# Patient Record
Sex: Male | Born: 1950
Health system: Southern US, Community
[De-identification: ages and names within clinical notes are randomized; demographics above are authoritative.]

## PROBLEM LIST (undated history)

## (undated) DIAGNOSIS — E785 Hyperlipidemia, unspecified: Secondary | ICD-10-CM

## (undated) DIAGNOSIS — R7989 Other specified abnormal findings of blood chemistry: Secondary | ICD-10-CM

## (undated) DIAGNOSIS — K635 Polyp of colon: Secondary | ICD-10-CM

## (undated) DIAGNOSIS — C61 Malignant neoplasm of prostate: Secondary | ICD-10-CM

## (undated) DIAGNOSIS — K5790 Diverticulosis of intestine, part unspecified, without perforation or abscess without bleeding: Secondary | ICD-10-CM

## (undated) DIAGNOSIS — K219 Gastro-esophageal reflux disease without esophagitis: Secondary | ICD-10-CM

## (undated) DIAGNOSIS — E538 Deficiency of other specified B group vitamins: Secondary | ICD-10-CM

## (undated) HISTORY — DX: Malignant neoplasm of prostate: C61

## (undated) HISTORY — DX: Other specified abnormal findings of blood chemistry: R79.89

## (undated) HISTORY — DX: Deficiency of other specified B group vitamins: E53.8

## (undated) HISTORY — DX: Hyperlipidemia, unspecified: E78.5

## (undated) HISTORY — DX: Diverticulosis of intestine, part unspecified, without perforation or abscess without bleeding: K57.90

## (undated) HISTORY — DX: Polyp of colon: K63.5

## (undated) HISTORY — DX: Gastro-esophageal reflux disease without esophagitis: K21.9

## (undated) HISTORY — PX: BACK SURGERY: SHX140

---

## 1983-09-13 HISTORY — PX: BACK SURGERY: SHX140

## 2003-01-15 ENCOUNTER — Ambulatory Visit (HOSPITAL_COMMUNITY): Admission: RE | Admit: 2003-01-15 | Discharge: 2003-01-15 | Payer: Self-pay | Admitting: Cardiology

## 2004-11-30 ENCOUNTER — Ambulatory Visit: Payer: Self-pay

## 2005-11-30 ENCOUNTER — Ambulatory Visit: Payer: Self-pay | Admitting: Podiatry

## 2011-02-04 ENCOUNTER — Ambulatory Visit: Payer: Self-pay | Admitting: Internal Medicine

## 2011-02-11 ENCOUNTER — Ambulatory Visit: Payer: Self-pay | Admitting: Internal Medicine

## 2011-03-13 ENCOUNTER — Ambulatory Visit: Payer: Self-pay | Admitting: Internal Medicine

## 2011-04-13 ENCOUNTER — Ambulatory Visit: Payer: Self-pay | Admitting: Internal Medicine

## 2015-03-27 ENCOUNTER — Ambulatory Visit: Admit: 2015-03-27 | Payer: Self-pay | Admitting: Unknown Physician Specialty

## 2015-03-27 SURGERY — SEPTOPLASTY, NOSE, WITH NASAL TURBINATE REDUCTION
Anesthesia: General | Laterality: Bilateral

## 2015-10-01 DIAGNOSIS — K219 Gastro-esophageal reflux disease without esophagitis: Secondary | ICD-10-CM | POA: Diagnosis not present

## 2015-10-01 DIAGNOSIS — Z0000 Encounter for general adult medical examination without abnormal findings: Secondary | ICD-10-CM | POA: Diagnosis not present

## 2015-10-01 DIAGNOSIS — Z125 Encounter for screening for malignant neoplasm of prostate: Secondary | ICD-10-CM | POA: Diagnosis not present

## 2015-10-29 DIAGNOSIS — L821 Other seborrheic keratosis: Secondary | ICD-10-CM | POA: Diagnosis not present

## 2015-10-29 DIAGNOSIS — C44329 Squamous cell carcinoma of skin of other parts of face: Secondary | ICD-10-CM | POA: Diagnosis not present

## 2015-10-29 DIAGNOSIS — C44622 Squamous cell carcinoma of skin of right upper limb, including shoulder: Secondary | ICD-10-CM | POA: Diagnosis not present

## 2015-10-29 DIAGNOSIS — X32XXXA Exposure to sunlight, initial encounter: Secondary | ICD-10-CM | POA: Diagnosis not present

## 2015-10-29 DIAGNOSIS — L570 Actinic keratosis: Secondary | ICD-10-CM | POA: Diagnosis not present

## 2015-10-29 DIAGNOSIS — Z85828 Personal history of other malignant neoplasm of skin: Secondary | ICD-10-CM | POA: Diagnosis not present

## 2015-10-29 DIAGNOSIS — Z08 Encounter for follow-up examination after completed treatment for malignant neoplasm: Secondary | ICD-10-CM | POA: Diagnosis not present

## 2015-10-29 DIAGNOSIS — D045 Carcinoma in situ of skin of trunk: Secondary | ICD-10-CM | POA: Diagnosis not present

## 2015-10-29 DIAGNOSIS — C44519 Basal cell carcinoma of skin of other part of trunk: Secondary | ICD-10-CM | POA: Diagnosis not present

## 2015-10-29 DIAGNOSIS — D485 Neoplasm of uncertain behavior of skin: Secondary | ICD-10-CM | POA: Diagnosis not present

## 2015-11-10 DIAGNOSIS — M5414 Radiculopathy, thoracic region: Secondary | ICD-10-CM | POA: Diagnosis not present

## 2015-11-10 DIAGNOSIS — Z0000 Encounter for general adult medical examination without abnormal findings: Secondary | ICD-10-CM | POA: Diagnosis not present

## 2015-11-30 DIAGNOSIS — E291 Testicular hypofunction: Secondary | ICD-10-CM | POA: Diagnosis not present

## 2015-11-30 DIAGNOSIS — N5201 Erectile dysfunction due to arterial insufficiency: Secondary | ICD-10-CM | POA: Diagnosis not present

## 2015-11-30 DIAGNOSIS — N432 Other hydrocele: Secondary | ICD-10-CM | POA: Diagnosis not present

## 2015-11-30 DIAGNOSIS — N401 Enlarged prostate with lower urinary tract symptoms: Secondary | ICD-10-CM | POA: Diagnosis not present

## 2015-12-22 DIAGNOSIS — D0439 Carcinoma in situ of skin of other parts of face: Secondary | ICD-10-CM | POA: Diagnosis not present

## 2015-12-22 DIAGNOSIS — L905 Scar conditions and fibrosis of skin: Secondary | ICD-10-CM | POA: Diagnosis not present

## 2015-12-22 DIAGNOSIS — C44329 Squamous cell carcinoma of skin of other parts of face: Secondary | ICD-10-CM | POA: Diagnosis not present

## 2016-01-12 DIAGNOSIS — L570 Actinic keratosis: Secondary | ICD-10-CM | POA: Diagnosis not present

## 2016-01-12 DIAGNOSIS — C44622 Squamous cell carcinoma of skin of right upper limb, including shoulder: Secondary | ICD-10-CM | POA: Diagnosis not present

## 2016-01-12 DIAGNOSIS — D0461 Carcinoma in situ of skin of right upper limb, including shoulder: Secondary | ICD-10-CM | POA: Diagnosis not present

## 2016-01-12 DIAGNOSIS — L905 Scar conditions and fibrosis of skin: Secondary | ICD-10-CM | POA: Diagnosis not present

## 2016-01-26 DIAGNOSIS — C44519 Basal cell carcinoma of skin of other part of trunk: Secondary | ICD-10-CM | POA: Diagnosis not present

## 2016-01-26 DIAGNOSIS — D045 Carcinoma in situ of skin of trunk: Secondary | ICD-10-CM | POA: Diagnosis not present

## 2016-03-21 DIAGNOSIS — K219 Gastro-esophageal reflux disease without esophagitis: Secondary | ICD-10-CM | POA: Diagnosis not present

## 2016-03-21 DIAGNOSIS — R070 Pain in throat: Secondary | ICD-10-CM | POA: Diagnosis not present

## 2016-04-27 DIAGNOSIS — Z85828 Personal history of other malignant neoplasm of skin: Secondary | ICD-10-CM | POA: Diagnosis not present

## 2016-04-27 DIAGNOSIS — L728 Other follicular cysts of the skin and subcutaneous tissue: Secondary | ICD-10-CM | POA: Diagnosis not present

## 2016-04-27 DIAGNOSIS — D485 Neoplasm of uncertain behavior of skin: Secondary | ICD-10-CM | POA: Diagnosis not present

## 2016-04-27 DIAGNOSIS — L820 Inflamed seborrheic keratosis: Secondary | ICD-10-CM | POA: Diagnosis not present

## 2016-04-27 DIAGNOSIS — Z08 Encounter for follow-up examination after completed treatment for malignant neoplasm: Secondary | ICD-10-CM | POA: Diagnosis not present

## 2016-04-27 DIAGNOSIS — L570 Actinic keratosis: Secondary | ICD-10-CM | POA: Diagnosis not present

## 2016-04-27 DIAGNOSIS — X32XXXA Exposure to sunlight, initial encounter: Secondary | ICD-10-CM | POA: Diagnosis not present

## 2016-04-27 DIAGNOSIS — L821 Other seborrheic keratosis: Secondary | ICD-10-CM | POA: Diagnosis not present

## 2016-05-02 DIAGNOSIS — K116 Mucocele of salivary gland: Secondary | ICD-10-CM | POA: Diagnosis not present

## 2016-05-02 DIAGNOSIS — K219 Gastro-esophageal reflux disease without esophagitis: Secondary | ICD-10-CM | POA: Diagnosis not present

## 2016-05-27 DIAGNOSIS — N433 Hydrocele, unspecified: Secondary | ICD-10-CM | POA: Diagnosis not present

## 2016-05-27 DIAGNOSIS — N401 Enlarged prostate with lower urinary tract symptoms: Secondary | ICD-10-CM | POA: Diagnosis not present

## 2016-05-27 DIAGNOSIS — Z79899 Other long term (current) drug therapy: Secondary | ICD-10-CM | POA: Diagnosis not present

## 2016-05-27 DIAGNOSIS — E291 Testicular hypofunction: Secondary | ICD-10-CM | POA: Diagnosis not present

## 2016-05-27 DIAGNOSIS — N5201 Erectile dysfunction due to arterial insufficiency: Secondary | ICD-10-CM | POA: Diagnosis not present

## 2016-06-15 DIAGNOSIS — S335XXA Sprain of ligaments of lumbar spine, initial encounter: Secondary | ICD-10-CM | POA: Diagnosis not present

## 2016-06-15 DIAGNOSIS — M9903 Segmental and somatic dysfunction of lumbar region: Secondary | ICD-10-CM | POA: Diagnosis not present

## 2016-06-22 DIAGNOSIS — S335XXA Sprain of ligaments of lumbar spine, initial encounter: Secondary | ICD-10-CM | POA: Diagnosis not present

## 2016-06-22 DIAGNOSIS — M9903 Segmental and somatic dysfunction of lumbar region: Secondary | ICD-10-CM | POA: Diagnosis not present

## 2016-06-28 DIAGNOSIS — S335XXA Sprain of ligaments of lumbar spine, initial encounter: Secondary | ICD-10-CM | POA: Diagnosis not present

## 2016-06-28 DIAGNOSIS — M9903 Segmental and somatic dysfunction of lumbar region: Secondary | ICD-10-CM | POA: Diagnosis not present

## 2016-06-29 DIAGNOSIS — R350 Frequency of micturition: Secondary | ICD-10-CM | POA: Diagnosis not present

## 2016-06-29 DIAGNOSIS — R3915 Urgency of urination: Secondary | ICD-10-CM | POA: Diagnosis not present

## 2016-06-29 DIAGNOSIS — R3914 Feeling of incomplete bladder emptying: Secondary | ICD-10-CM | POA: Diagnosis not present

## 2016-06-29 DIAGNOSIS — N401 Enlarged prostate with lower urinary tract symptoms: Secondary | ICD-10-CM | POA: Diagnosis not present

## 2016-07-04 DIAGNOSIS — N401 Enlarged prostate with lower urinary tract symptoms: Secondary | ICD-10-CM | POA: Diagnosis not present

## 2016-07-04 DIAGNOSIS — R3914 Feeling of incomplete bladder emptying: Secondary | ICD-10-CM | POA: Diagnosis not present

## 2016-07-04 DIAGNOSIS — R3915 Urgency of urination: Secondary | ICD-10-CM | POA: Diagnosis not present

## 2016-07-04 DIAGNOSIS — R350 Frequency of micturition: Secondary | ICD-10-CM | POA: Diagnosis not present

## 2016-07-05 DIAGNOSIS — R3915 Urgency of urination: Secondary | ICD-10-CM | POA: Diagnosis not present

## 2016-07-05 DIAGNOSIS — R3916 Straining to void: Secondary | ICD-10-CM | POA: Diagnosis not present

## 2016-07-05 DIAGNOSIS — R3914 Feeling of incomplete bladder emptying: Secondary | ICD-10-CM | POA: Diagnosis not present

## 2016-07-05 DIAGNOSIS — N401 Enlarged prostate with lower urinary tract symptoms: Secondary | ICD-10-CM | POA: Diagnosis not present

## 2016-07-07 DIAGNOSIS — R3915 Urgency of urination: Secondary | ICD-10-CM | POA: Diagnosis not present

## 2016-07-07 DIAGNOSIS — N401 Enlarged prostate with lower urinary tract symptoms: Secondary | ICD-10-CM | POA: Diagnosis not present

## 2016-07-07 DIAGNOSIS — R3914 Feeling of incomplete bladder emptying: Secondary | ICD-10-CM | POA: Diagnosis not present

## 2016-07-07 DIAGNOSIS — R350 Frequency of micturition: Secondary | ICD-10-CM | POA: Diagnosis not present

## 2016-07-29 DIAGNOSIS — R3915 Urgency of urination: Secondary | ICD-10-CM | POA: Diagnosis not present

## 2016-07-29 DIAGNOSIS — R350 Frequency of micturition: Secondary | ICD-10-CM | POA: Diagnosis not present

## 2016-07-29 DIAGNOSIS — N401 Enlarged prostate with lower urinary tract symptoms: Secondary | ICD-10-CM | POA: Diagnosis not present

## 2016-07-29 DIAGNOSIS — R3914 Feeling of incomplete bladder emptying: Secondary | ICD-10-CM | POA: Diagnosis not present

## 2016-08-10 DIAGNOSIS — N401 Enlarged prostate with lower urinary tract symptoms: Secondary | ICD-10-CM | POA: Diagnosis not present

## 2016-08-10 DIAGNOSIS — R102 Pelvic and perineal pain: Secondary | ICD-10-CM | POA: Diagnosis not present

## 2016-09-07 DIAGNOSIS — E291 Testicular hypofunction: Secondary | ICD-10-CM | POA: Diagnosis not present

## 2016-09-07 DIAGNOSIS — N401 Enlarged prostate with lower urinary tract symptoms: Secondary | ICD-10-CM | POA: Diagnosis not present

## 2016-09-07 DIAGNOSIS — N5201 Erectile dysfunction due to arterial insufficiency: Secondary | ICD-10-CM | POA: Diagnosis not present

## 2016-11-01 DIAGNOSIS — Z125 Encounter for screening for malignant neoplasm of prostate: Secondary | ICD-10-CM | POA: Diagnosis not present

## 2016-11-01 DIAGNOSIS — E559 Vitamin D deficiency, unspecified: Secondary | ICD-10-CM | POA: Diagnosis not present

## 2016-11-01 DIAGNOSIS — K116 Mucocele of salivary gland: Secondary | ICD-10-CM | POA: Diagnosis not present

## 2016-11-01 DIAGNOSIS — Z0000 Encounter for general adult medical examination without abnormal findings: Secondary | ICD-10-CM | POA: Diagnosis not present

## 2016-11-01 DIAGNOSIS — K219 Gastro-esophageal reflux disease without esophagitis: Secondary | ICD-10-CM | POA: Diagnosis not present

## 2016-11-01 DIAGNOSIS — E291 Testicular hypofunction: Secondary | ICD-10-CM | POA: Diagnosis not present

## 2016-11-02 DIAGNOSIS — L821 Other seborrheic keratosis: Secondary | ICD-10-CM | POA: Diagnosis not present

## 2016-11-02 DIAGNOSIS — X32XXXA Exposure to sunlight, initial encounter: Secondary | ICD-10-CM | POA: Diagnosis not present

## 2016-11-02 DIAGNOSIS — Z08 Encounter for follow-up examination after completed treatment for malignant neoplasm: Secondary | ICD-10-CM | POA: Diagnosis not present

## 2016-11-02 DIAGNOSIS — L570 Actinic keratosis: Secondary | ICD-10-CM | POA: Diagnosis not present

## 2016-11-02 DIAGNOSIS — Z85828 Personal history of other malignant neoplasm of skin: Secondary | ICD-10-CM | POA: Diagnosis not present

## 2016-11-02 DIAGNOSIS — C44519 Basal cell carcinoma of skin of other part of trunk: Secondary | ICD-10-CM | POA: Diagnosis not present

## 2016-11-02 DIAGNOSIS — D485 Neoplasm of uncertain behavior of skin: Secondary | ICD-10-CM | POA: Diagnosis not present

## 2016-11-08 DIAGNOSIS — N401 Enlarged prostate with lower urinary tract symptoms: Secondary | ICD-10-CM | POA: Diagnosis not present

## 2016-11-10 DIAGNOSIS — Z0000 Encounter for general adult medical examination without abnormal findings: Secondary | ICD-10-CM | POA: Insufficient documentation

## 2016-11-11 DIAGNOSIS — E291 Testicular hypofunction: Secondary | ICD-10-CM | POA: Diagnosis not present

## 2016-11-11 DIAGNOSIS — N401 Enlarged prostate with lower urinary tract symptoms: Secondary | ICD-10-CM | POA: Diagnosis not present

## 2016-11-11 DIAGNOSIS — N5201 Erectile dysfunction due to arterial insufficiency: Secondary | ICD-10-CM | POA: Diagnosis not present

## 2016-12-13 DIAGNOSIS — L905 Scar conditions and fibrosis of skin: Secondary | ICD-10-CM | POA: Diagnosis not present

## 2016-12-13 DIAGNOSIS — C44519 Basal cell carcinoma of skin of other part of trunk: Secondary | ICD-10-CM | POA: Diagnosis not present

## 2017-01-18 DIAGNOSIS — L089 Local infection of the skin and subcutaneous tissue, unspecified: Secondary | ICD-10-CM | POA: Diagnosis not present

## 2017-03-14 DIAGNOSIS — D485 Neoplasm of uncertain behavior of skin: Secondary | ICD-10-CM | POA: Diagnosis not present

## 2017-03-14 DIAGNOSIS — Z85828 Personal history of other malignant neoplasm of skin: Secondary | ICD-10-CM | POA: Diagnosis not present

## 2017-03-14 DIAGNOSIS — X32XXXA Exposure to sunlight, initial encounter: Secondary | ICD-10-CM | POA: Diagnosis not present

## 2017-03-14 DIAGNOSIS — Z08 Encounter for follow-up examination after completed treatment for malignant neoplasm: Secondary | ICD-10-CM | POA: Diagnosis not present

## 2017-03-14 DIAGNOSIS — D0461 Carcinoma in situ of skin of right upper limb, including shoulder: Secondary | ICD-10-CM | POA: Diagnosis not present

## 2017-03-14 DIAGNOSIS — L570 Actinic keratosis: Secondary | ICD-10-CM | POA: Diagnosis not present

## 2017-03-27 DIAGNOSIS — D0461 Carcinoma in situ of skin of right upper limb, including shoulder: Secondary | ICD-10-CM | POA: Diagnosis not present

## 2017-05-17 DIAGNOSIS — E291 Testicular hypofunction: Secondary | ICD-10-CM | POA: Diagnosis not present

## 2017-05-17 DIAGNOSIS — N401 Enlarged prostate with lower urinary tract symptoms: Secondary | ICD-10-CM | POA: Diagnosis not present

## 2017-05-17 DIAGNOSIS — N5201 Erectile dysfunction due to arterial insufficiency: Secondary | ICD-10-CM | POA: Diagnosis not present

## 2017-05-17 DIAGNOSIS — Z79899 Other long term (current) drug therapy: Secondary | ICD-10-CM | POA: Diagnosis not present

## 2017-06-27 DIAGNOSIS — M9903 Segmental and somatic dysfunction of lumbar region: Secondary | ICD-10-CM | POA: Diagnosis not present

## 2017-06-27 DIAGNOSIS — M5386 Other specified dorsopathies, lumbar region: Secondary | ICD-10-CM | POA: Diagnosis not present

## 2017-08-28 ENCOUNTER — Ambulatory Visit
Admission: RE | Admit: 2017-08-28 | Discharge: 2017-08-28 | Disposition: A | Discharge status: Home / Self Care | Payer: Self-pay | Source: Ambulatory Visit | Attending: Physical Medicine & Rehabilitation | Admitting: Physical Medicine & Rehabilitation

## 2017-08-28 ENCOUNTER — Other Ambulatory Visit: Payer: Self-pay | Admitting: Physical Medicine & Rehabilitation

## 2017-08-28 DIAGNOSIS — M19012 Primary osteoarthritis, left shoulder: Secondary | ICD-10-CM | POA: Diagnosis not present

## 2017-08-28 DIAGNOSIS — M25512 Pain in left shoulder: Secondary | ICD-10-CM

## 2017-08-28 DIAGNOSIS — M25612 Stiffness of left shoulder, not elsewhere classified: Secondary | ICD-10-CM | POA: Diagnosis not present

## 2017-09-12 DIAGNOSIS — D126 Benign neoplasm of colon, unspecified: Secondary | ICD-10-CM

## 2017-09-12 HISTORY — DX: Benign neoplasm of colon, unspecified: D12.6

## 2017-09-14 DIAGNOSIS — M25512 Pain in left shoulder: Secondary | ICD-10-CM | POA: Diagnosis not present

## 2017-09-14 DIAGNOSIS — M7542 Impingement syndrome of left shoulder: Secondary | ICD-10-CM | POA: Diagnosis not present

## 2017-09-19 DIAGNOSIS — D2272 Melanocytic nevi of left lower limb, including hip: Secondary | ICD-10-CM | POA: Diagnosis not present

## 2017-09-19 DIAGNOSIS — D225 Melanocytic nevi of trunk: Secondary | ICD-10-CM | POA: Diagnosis not present

## 2017-09-19 DIAGNOSIS — L821 Other seborrheic keratosis: Secondary | ICD-10-CM | POA: Diagnosis not present

## 2017-09-19 DIAGNOSIS — L570 Actinic keratosis: Secondary | ICD-10-CM | POA: Diagnosis not present

## 2017-09-19 DIAGNOSIS — X32XXXA Exposure to sunlight, initial encounter: Secondary | ICD-10-CM | POA: Diagnosis not present

## 2017-09-19 DIAGNOSIS — Z85828 Personal history of other malignant neoplasm of skin: Secondary | ICD-10-CM | POA: Diagnosis not present

## 2017-09-20 DIAGNOSIS — M25512 Pain in left shoulder: Secondary | ICD-10-CM | POA: Diagnosis not present

## 2017-09-20 DIAGNOSIS — M7542 Impingement syndrome of left shoulder: Secondary | ICD-10-CM | POA: Diagnosis not present

## 2017-09-21 DIAGNOSIS — Z800 Family history of malignant neoplasm of digestive organs: Secondary | ICD-10-CM | POA: Diagnosis not present

## 2017-09-21 DIAGNOSIS — K219 Gastro-esophageal reflux disease without esophagitis: Secondary | ICD-10-CM | POA: Diagnosis not present

## 2017-10-16 DIAGNOSIS — K449 Diaphragmatic hernia without obstruction or gangrene: Secondary | ICD-10-CM | POA: Diagnosis not present

## 2017-10-16 DIAGNOSIS — Z86010 Personal history of colonic polyps: Secondary | ICD-10-CM | POA: Diagnosis not present

## 2017-10-16 DIAGNOSIS — K317 Polyp of stomach and duodenum: Secondary | ICD-10-CM | POA: Diagnosis not present

## 2017-10-16 DIAGNOSIS — K640 First degree hemorrhoids: Secondary | ICD-10-CM | POA: Diagnosis not present

## 2017-10-16 DIAGNOSIS — K219 Gastro-esophageal reflux disease without esophagitis: Secondary | ICD-10-CM | POA: Diagnosis not present

## 2017-10-16 DIAGNOSIS — D126 Benign neoplasm of colon, unspecified: Secondary | ICD-10-CM | POA: Diagnosis not present

## 2017-10-19 DIAGNOSIS — K317 Polyp of stomach and duodenum: Secondary | ICD-10-CM | POA: Diagnosis not present

## 2017-11-01 DIAGNOSIS — K219 Gastro-esophageal reflux disease without esophagitis: Secondary | ICD-10-CM | POA: Diagnosis not present

## 2017-11-01 DIAGNOSIS — J309 Allergic rhinitis, unspecified: Secondary | ICD-10-CM | POA: Diagnosis not present

## 2017-11-07 DIAGNOSIS — Z125 Encounter for screening for malignant neoplasm of prostate: Secondary | ICD-10-CM | POA: Diagnosis not present

## 2017-11-07 DIAGNOSIS — Z0000 Encounter for general adult medical examination without abnormal findings: Secondary | ICD-10-CM | POA: Diagnosis not present

## 2017-11-14 DIAGNOSIS — M659 Synovitis and tenosynovitis, unspecified: Secondary | ICD-10-CM | POA: Diagnosis not present

## 2017-11-14 DIAGNOSIS — Z8042 Family history of malignant neoplasm of prostate: Secondary | ICD-10-CM | POA: Insufficient documentation

## 2017-11-14 DIAGNOSIS — Z125 Encounter for screening for malignant neoplasm of prostate: Secondary | ICD-10-CM | POA: Diagnosis not present

## 2017-11-14 DIAGNOSIS — D369 Benign neoplasm, unspecified site: Secondary | ICD-10-CM | POA: Insufficient documentation

## 2017-11-14 DIAGNOSIS — Z79899 Other long term (current) drug therapy: Secondary | ICD-10-CM | POA: Diagnosis not present

## 2017-11-14 DIAGNOSIS — M7542 Impingement syndrome of left shoulder: Secondary | ICD-10-CM | POA: Diagnosis not present

## 2017-11-14 DIAGNOSIS — Z0000 Encounter for general adult medical examination without abnormal findings: Secondary | ICD-10-CM | POA: Diagnosis not present

## 2017-11-22 DIAGNOSIS — E291 Testicular hypofunction: Secondary | ICD-10-CM | POA: Diagnosis not present

## 2017-11-22 DIAGNOSIS — N5201 Erectile dysfunction due to arterial insufficiency: Secondary | ICD-10-CM | POA: Diagnosis not present

## 2017-11-22 DIAGNOSIS — Z79899 Other long term (current) drug therapy: Secondary | ICD-10-CM | POA: Diagnosis not present

## 2017-12-11 DIAGNOSIS — N411 Chronic prostatitis: Secondary | ICD-10-CM | POA: Diagnosis not present

## 2017-12-11 DIAGNOSIS — R1032 Left lower quadrant pain: Secondary | ICD-10-CM | POA: Diagnosis not present

## 2018-01-04 DIAGNOSIS — R1032 Left lower quadrant pain: Secondary | ICD-10-CM | POA: Diagnosis not present

## 2018-01-04 DIAGNOSIS — R102 Pelvic and perineal pain: Secondary | ICD-10-CM | POA: Diagnosis not present

## 2018-01-04 DIAGNOSIS — R3914 Feeling of incomplete bladder emptying: Secondary | ICD-10-CM | POA: Diagnosis not present

## 2018-01-04 DIAGNOSIS — N401 Enlarged prostate with lower urinary tract symptoms: Secondary | ICD-10-CM | POA: Diagnosis not present

## 2018-01-05 ENCOUNTER — Other Ambulatory Visit: Payer: Self-pay | Admitting: Urology

## 2018-01-05 DIAGNOSIS — R1032 Left lower quadrant pain: Secondary | ICD-10-CM

## 2018-01-19 ENCOUNTER — Ambulatory Visit
Admission: RE | Admit: 2018-01-19 | Discharge: 2018-01-19 | Disposition: A | Discharge status: Home / Self Care | Payer: PPO | Source: Ambulatory Visit | Attending: Urology | Admitting: Urology

## 2018-01-19 DIAGNOSIS — N200 Calculus of kidney: Secondary | ICD-10-CM | POA: Diagnosis not present

## 2018-01-19 DIAGNOSIS — K5730 Diverticulosis of large intestine without perforation or abscess without bleeding: Secondary | ICD-10-CM | POA: Diagnosis not present

## 2018-01-19 DIAGNOSIS — N400 Enlarged prostate without lower urinary tract symptoms: Secondary | ICD-10-CM | POA: Insufficient documentation

## 2018-01-19 DIAGNOSIS — R1032 Left lower quadrant pain: Secondary | ICD-10-CM | POA: Insufficient documentation

## 2018-01-19 LAB — POCT I-STAT CREATININE: Creatinine, Ser: 1 mg/dL (ref 0.61–1.24)

## 2018-01-19 MED ORDER — IOPAMIDOL (ISOVUE-370) INJECTION 76%
75.0000 mL | Freq: Once | INTRAVENOUS | Status: AC | PRN
  Administered 2018-01-19: 75 mL via INTRAVENOUS

## 2018-01-23 DIAGNOSIS — R1012 Left upper quadrant pain: Secondary | ICD-10-CM | POA: Diagnosis not present

## 2018-01-23 DIAGNOSIS — N200 Calculus of kidney: Secondary | ICD-10-CM | POA: Diagnosis not present

## 2018-01-23 DIAGNOSIS — N401 Enlarged prostate with lower urinary tract symptoms: Secondary | ICD-10-CM | POA: Diagnosis not present

## 2018-03-19 DIAGNOSIS — L538 Other specified erythematous conditions: Secondary | ICD-10-CM | POA: Diagnosis not present

## 2018-03-19 DIAGNOSIS — L820 Inflamed seborrheic keratosis: Secondary | ICD-10-CM | POA: Diagnosis not present

## 2018-03-19 DIAGNOSIS — L570 Actinic keratosis: Secondary | ICD-10-CM | POA: Diagnosis not present

## 2018-03-19 DIAGNOSIS — R208 Other disturbances of skin sensation: Secondary | ICD-10-CM | POA: Diagnosis not present

## 2018-03-19 DIAGNOSIS — D2272 Melanocytic nevi of left lower limb, including hip: Secondary | ICD-10-CM | POA: Diagnosis not present

## 2018-03-19 DIAGNOSIS — Z85828 Personal history of other malignant neoplasm of skin: Secondary | ICD-10-CM | POA: Diagnosis not present

## 2018-03-19 DIAGNOSIS — D2261 Melanocytic nevi of right upper limb, including shoulder: Secondary | ICD-10-CM | POA: Diagnosis not present

## 2018-03-19 DIAGNOSIS — X32XXXA Exposure to sunlight, initial encounter: Secondary | ICD-10-CM | POA: Diagnosis not present

## 2018-03-19 DIAGNOSIS — D2262 Melanocytic nevi of left upper limb, including shoulder: Secondary | ICD-10-CM | POA: Diagnosis not present

## 2018-05-28 DIAGNOSIS — E291 Testicular hypofunction: Secondary | ICD-10-CM | POA: Diagnosis not present

## 2018-05-28 DIAGNOSIS — N401 Enlarged prostate with lower urinary tract symptoms: Secondary | ICD-10-CM | POA: Diagnosis not present

## 2018-05-28 DIAGNOSIS — N5201 Erectile dysfunction due to arterial insufficiency: Secondary | ICD-10-CM | POA: Diagnosis not present

## 2018-05-28 DIAGNOSIS — Z79899 Other long term (current) drug therapy: Secondary | ICD-10-CM | POA: Diagnosis not present

## 2018-08-07 DIAGNOSIS — D225 Melanocytic nevi of trunk: Secondary | ICD-10-CM | POA: Diagnosis not present

## 2018-08-07 DIAGNOSIS — D2261 Melanocytic nevi of right upper limb, including shoulder: Secondary | ICD-10-CM | POA: Diagnosis not present

## 2018-08-07 DIAGNOSIS — D2271 Melanocytic nevi of right lower limb, including hip: Secondary | ICD-10-CM | POA: Diagnosis not present

## 2018-08-07 DIAGNOSIS — D2262 Melanocytic nevi of left upper limb, including shoulder: Secondary | ICD-10-CM | POA: Diagnosis not present

## 2018-08-07 DIAGNOSIS — L570 Actinic keratosis: Secondary | ICD-10-CM | POA: Diagnosis not present

## 2018-08-07 DIAGNOSIS — X32XXXA Exposure to sunlight, initial encounter: Secondary | ICD-10-CM | POA: Diagnosis not present

## 2018-08-07 DIAGNOSIS — D045 Carcinoma in situ of skin of trunk: Secondary | ICD-10-CM | POA: Diagnosis not present

## 2018-08-07 DIAGNOSIS — D485 Neoplasm of uncertain behavior of skin: Secondary | ICD-10-CM | POA: Diagnosis not present

## 2018-08-07 DIAGNOSIS — Z85828 Personal history of other malignant neoplasm of skin: Secondary | ICD-10-CM | POA: Diagnosis not present

## 2018-08-07 DIAGNOSIS — D2272 Melanocytic nevi of left lower limb, including hip: Secondary | ICD-10-CM | POA: Diagnosis not present

## 2018-08-07 DIAGNOSIS — Z08 Encounter for follow-up examination after completed treatment for malignant neoplasm: Secondary | ICD-10-CM | POA: Diagnosis not present

## 2018-08-15 DIAGNOSIS — D045 Carcinoma in situ of skin of trunk: Secondary | ICD-10-CM | POA: Diagnosis not present

## 2018-10-26 DIAGNOSIS — M9901 Segmental and somatic dysfunction of cervical region: Secondary | ICD-10-CM | POA: Diagnosis not present

## 2018-10-26 DIAGNOSIS — M5412 Radiculopathy, cervical region: Secondary | ICD-10-CM | POA: Diagnosis not present

## 2018-10-26 DIAGNOSIS — M9902 Segmental and somatic dysfunction of thoracic region: Secondary | ICD-10-CM | POA: Diagnosis not present

## 2018-10-26 DIAGNOSIS — M62830 Muscle spasm of back: Secondary | ICD-10-CM | POA: Diagnosis not present

## 2018-10-29 DIAGNOSIS — M9901 Segmental and somatic dysfunction of cervical region: Secondary | ICD-10-CM | POA: Diagnosis not present

## 2018-10-29 DIAGNOSIS — M5412 Radiculopathy, cervical region: Secondary | ICD-10-CM | POA: Diagnosis not present

## 2018-10-29 DIAGNOSIS — M62830 Muscle spasm of back: Secondary | ICD-10-CM | POA: Diagnosis not present

## 2018-10-29 DIAGNOSIS — M9902 Segmental and somatic dysfunction of thoracic region: Secondary | ICD-10-CM | POA: Diagnosis not present

## 2018-10-31 DIAGNOSIS — M9902 Segmental and somatic dysfunction of thoracic region: Secondary | ICD-10-CM | POA: Diagnosis not present

## 2018-10-31 DIAGNOSIS — M5412 Radiculopathy, cervical region: Secondary | ICD-10-CM | POA: Diagnosis not present

## 2018-10-31 DIAGNOSIS — M62830 Muscle spasm of back: Secondary | ICD-10-CM | POA: Diagnosis not present

## 2018-10-31 DIAGNOSIS — M9901 Segmental and somatic dysfunction of cervical region: Secondary | ICD-10-CM | POA: Diagnosis not present

## 2018-11-05 DIAGNOSIS — J309 Allergic rhinitis, unspecified: Secondary | ICD-10-CM | POA: Diagnosis not present

## 2018-11-05 DIAGNOSIS — K219 Gastro-esophageal reflux disease without esophagitis: Secondary | ICD-10-CM | POA: Diagnosis not present

## 2018-11-06 DIAGNOSIS — M9902 Segmental and somatic dysfunction of thoracic region: Secondary | ICD-10-CM | POA: Diagnosis not present

## 2018-11-06 DIAGNOSIS — M5412 Radiculopathy, cervical region: Secondary | ICD-10-CM | POA: Diagnosis not present

## 2018-11-06 DIAGNOSIS — M62830 Muscle spasm of back: Secondary | ICD-10-CM | POA: Diagnosis not present

## 2018-11-06 DIAGNOSIS — M9901 Segmental and somatic dysfunction of cervical region: Secondary | ICD-10-CM | POA: Diagnosis not present

## 2018-11-09 DIAGNOSIS — H2513 Age-related nuclear cataract, bilateral: Secondary | ICD-10-CM | POA: Diagnosis not present

## 2018-11-13 DIAGNOSIS — M5412 Radiculopathy, cervical region: Secondary | ICD-10-CM | POA: Diagnosis not present

## 2018-11-13 DIAGNOSIS — M9902 Segmental and somatic dysfunction of thoracic region: Secondary | ICD-10-CM | POA: Diagnosis not present

## 2018-11-13 DIAGNOSIS — M9901 Segmental and somatic dysfunction of cervical region: Secondary | ICD-10-CM | POA: Diagnosis not present

## 2018-11-13 DIAGNOSIS — M62830 Muscle spasm of back: Secondary | ICD-10-CM | POA: Diagnosis not present

## 2018-11-16 DIAGNOSIS — Z79899 Other long term (current) drug therapy: Secondary | ICD-10-CM | POA: Diagnosis not present

## 2018-11-16 DIAGNOSIS — Z125 Encounter for screening for malignant neoplasm of prostate: Secondary | ICD-10-CM | POA: Diagnosis not present

## 2018-11-20 DIAGNOSIS — Z125 Encounter for screening for malignant neoplasm of prostate: Secondary | ICD-10-CM | POA: Diagnosis not present

## 2018-11-20 DIAGNOSIS — Z0000 Encounter for general adult medical examination without abnormal findings: Secondary | ICD-10-CM | POA: Diagnosis not present

## 2018-11-20 DIAGNOSIS — M7552 Bursitis of left shoulder: Secondary | ICD-10-CM | POA: Diagnosis not present

## 2018-11-20 DIAGNOSIS — Z79899 Other long term (current) drug therapy: Secondary | ICD-10-CM | POA: Diagnosis not present

## 2018-11-27 DIAGNOSIS — Z79899 Other long term (current) drug therapy: Secondary | ICD-10-CM | POA: Diagnosis not present

## 2018-11-27 DIAGNOSIS — E291 Testicular hypofunction: Secondary | ICD-10-CM | POA: Diagnosis not present

## 2018-11-27 DIAGNOSIS — N5201 Erectile dysfunction due to arterial insufficiency: Secondary | ICD-10-CM | POA: Diagnosis not present

## 2018-11-27 DIAGNOSIS — N401 Enlarged prostate with lower urinary tract symptoms: Secondary | ICD-10-CM | POA: Diagnosis not present

## 2018-11-30 DIAGNOSIS — M9901 Segmental and somatic dysfunction of cervical region: Secondary | ICD-10-CM | POA: Diagnosis not present

## 2018-11-30 DIAGNOSIS — M9902 Segmental and somatic dysfunction of thoracic region: Secondary | ICD-10-CM | POA: Diagnosis not present

## 2018-11-30 DIAGNOSIS — M5412 Radiculopathy, cervical region: Secondary | ICD-10-CM | POA: Diagnosis not present

## 2018-11-30 DIAGNOSIS — M62830 Muscle spasm of back: Secondary | ICD-10-CM | POA: Diagnosis not present

## 2019-03-22 DIAGNOSIS — D2272 Melanocytic nevi of left lower limb, including hip: Secondary | ICD-10-CM | POA: Diagnosis not present

## 2019-03-22 DIAGNOSIS — L821 Other seborrheic keratosis: Secondary | ICD-10-CM | POA: Diagnosis not present

## 2019-03-22 DIAGNOSIS — X32XXXA Exposure to sunlight, initial encounter: Secondary | ICD-10-CM | POA: Diagnosis not present

## 2019-03-22 DIAGNOSIS — D2262 Melanocytic nevi of left upper limb, including shoulder: Secondary | ICD-10-CM | POA: Diagnosis not present

## 2019-03-22 DIAGNOSIS — L570 Actinic keratosis: Secondary | ICD-10-CM | POA: Diagnosis not present

## 2019-03-22 DIAGNOSIS — Z08 Encounter for follow-up examination after completed treatment for malignant neoplasm: Secondary | ICD-10-CM | POA: Diagnosis not present

## 2019-03-22 DIAGNOSIS — D2271 Melanocytic nevi of right lower limb, including hip: Secondary | ICD-10-CM | POA: Diagnosis not present

## 2019-03-22 DIAGNOSIS — D2261 Melanocytic nevi of right upper limb, including shoulder: Secondary | ICD-10-CM | POA: Diagnosis not present

## 2019-03-22 DIAGNOSIS — Z85828 Personal history of other malignant neoplasm of skin: Secondary | ICD-10-CM | POA: Diagnosis not present

## 2019-03-22 DIAGNOSIS — D225 Melanocytic nevi of trunk: Secondary | ICD-10-CM | POA: Diagnosis not present

## 2019-05-31 DIAGNOSIS — E291 Testicular hypofunction: Secondary | ICD-10-CM | POA: Diagnosis not present

## 2019-05-31 DIAGNOSIS — Z79899 Other long term (current) drug therapy: Secondary | ICD-10-CM | POA: Diagnosis not present

## 2019-05-31 DIAGNOSIS — N401 Enlarged prostate with lower urinary tract symptoms: Secondary | ICD-10-CM | POA: Diagnosis not present

## 2019-05-31 DIAGNOSIS — N5201 Erectile dysfunction due to arterial insufficiency: Secondary | ICD-10-CM | POA: Diagnosis not present

## 2019-06-03 ENCOUNTER — Other Ambulatory Visit: Payer: Self-pay | Admitting: Urology

## 2019-06-03 DIAGNOSIS — R972 Elevated prostate specific antigen [PSA]: Secondary | ICD-10-CM

## 2019-06-14 ENCOUNTER — Other Ambulatory Visit: Payer: Self-pay

## 2019-06-14 ENCOUNTER — Ambulatory Visit
Admission: RE | Admit: 2019-06-14 | Discharge: 2019-06-14 | Disposition: A | Discharge status: Home / Self Care | Payer: PPO | Source: Ambulatory Visit | Attending: Urology | Admitting: Urology

## 2019-06-14 DIAGNOSIS — R972 Elevated prostate specific antigen [PSA]: Secondary | ICD-10-CM | POA: Diagnosis not present

## 2019-06-14 LAB — POCT I-STAT CREATININE: Creatinine, Ser: 0.9 mg/dL (ref 0.61–1.24)

## 2019-06-14 MED ORDER — GADOBUTROL 1 MMOL/ML IV SOLN
7.0000 mL | Freq: Once | INTRAVENOUS | Status: AC | PRN
  Administered 2019-06-14: 7 mL via INTRAVENOUS

## 2019-06-18 DIAGNOSIS — R972 Elevated prostate specific antigen [PSA]: Secondary | ICD-10-CM | POA: Diagnosis not present

## 2019-06-18 DIAGNOSIS — E291 Testicular hypofunction: Secondary | ICD-10-CM | POA: Diagnosis not present

## 2019-06-18 DIAGNOSIS — N401 Enlarged prostate with lower urinary tract symptoms: Secondary | ICD-10-CM | POA: Diagnosis not present

## 2019-06-18 DIAGNOSIS — N200 Calculus of kidney: Secondary | ICD-10-CM | POA: Diagnosis not present

## 2019-08-16 DIAGNOSIS — N411 Chronic prostatitis: Secondary | ICD-10-CM | POA: Diagnosis not present

## 2019-08-16 DIAGNOSIS — N5201 Erectile dysfunction due to arterial insufficiency: Secondary | ICD-10-CM | POA: Diagnosis not present

## 2019-08-16 DIAGNOSIS — R972 Elevated prostate specific antigen [PSA]: Secondary | ICD-10-CM | POA: Diagnosis not present

## 2019-08-16 DIAGNOSIS — R300 Dysuria: Secondary | ICD-10-CM | POA: Diagnosis not present

## 2019-09-23 DIAGNOSIS — D2262 Melanocytic nevi of left upper limb, including shoulder: Secondary | ICD-10-CM | POA: Diagnosis not present

## 2019-09-23 DIAGNOSIS — L821 Other seborrheic keratosis: Secondary | ICD-10-CM | POA: Diagnosis not present

## 2019-09-23 DIAGNOSIS — Z85828 Personal history of other malignant neoplasm of skin: Secondary | ICD-10-CM | POA: Diagnosis not present

## 2019-09-23 DIAGNOSIS — D2261 Melanocytic nevi of right upper limb, including shoulder: Secondary | ICD-10-CM | POA: Diagnosis not present

## 2019-09-23 DIAGNOSIS — L570 Actinic keratosis: Secondary | ICD-10-CM | POA: Diagnosis not present

## 2019-09-23 DIAGNOSIS — X32XXXA Exposure to sunlight, initial encounter: Secondary | ICD-10-CM | POA: Diagnosis not present

## 2019-09-23 DIAGNOSIS — D2271 Melanocytic nevi of right lower limb, including hip: Secondary | ICD-10-CM | POA: Diagnosis not present

## 2019-10-03 DIAGNOSIS — R972 Elevated prostate specific antigen [PSA]: Secondary | ICD-10-CM | POA: Diagnosis not present

## 2019-10-03 DIAGNOSIS — N401 Enlarged prostate with lower urinary tract symptoms: Secondary | ICD-10-CM | POA: Diagnosis not present

## 2019-10-11 DIAGNOSIS — E291 Testicular hypofunction: Secondary | ICD-10-CM | POA: Diagnosis not present

## 2019-10-11 DIAGNOSIS — N5201 Erectile dysfunction due to arterial insufficiency: Secondary | ICD-10-CM | POA: Diagnosis not present

## 2019-10-11 DIAGNOSIS — N401 Enlarged prostate with lower urinary tract symptoms: Secondary | ICD-10-CM | POA: Diagnosis not present

## 2019-10-11 DIAGNOSIS — R972 Elevated prostate specific antigen [PSA]: Secondary | ICD-10-CM | POA: Diagnosis not present

## 2019-11-06 DIAGNOSIS — K116 Mucocele of salivary gland: Secondary | ICD-10-CM | POA: Diagnosis not present

## 2019-11-06 DIAGNOSIS — K219 Gastro-esophageal reflux disease without esophagitis: Secondary | ICD-10-CM | POA: Diagnosis not present

## 2019-11-15 DIAGNOSIS — Z125 Encounter for screening for malignant neoplasm of prostate: Secondary | ICD-10-CM | POA: Diagnosis not present

## 2019-11-15 DIAGNOSIS — Z79899 Other long term (current) drug therapy: Secondary | ICD-10-CM | POA: Diagnosis not present

## 2019-11-19 DIAGNOSIS — N5201 Erectile dysfunction due to arterial insufficiency: Secondary | ICD-10-CM | POA: Diagnosis not present

## 2019-11-19 DIAGNOSIS — N401 Enlarged prostate with lower urinary tract symptoms: Secondary | ICD-10-CM | POA: Diagnosis not present

## 2019-11-19 DIAGNOSIS — E291 Testicular hypofunction: Secondary | ICD-10-CM | POA: Diagnosis not present

## 2019-11-19 DIAGNOSIS — Z79899 Other long term (current) drug therapy: Secondary | ICD-10-CM | POA: Diagnosis not present

## 2019-11-19 DIAGNOSIS — R972 Elevated prostate specific antigen [PSA]: Secondary | ICD-10-CM | POA: Diagnosis not present

## 2019-11-21 DIAGNOSIS — Z125 Encounter for screening for malignant neoplasm of prostate: Secondary | ICD-10-CM | POA: Diagnosis not present

## 2019-11-21 DIAGNOSIS — K219 Gastro-esophageal reflux disease without esophagitis: Secondary | ICD-10-CM | POA: Insufficient documentation

## 2019-11-21 DIAGNOSIS — Z0000 Encounter for general adult medical examination without abnormal findings: Secondary | ICD-10-CM | POA: Diagnosis not present

## 2019-11-21 DIAGNOSIS — E538 Deficiency of other specified B group vitamins: Secondary | ICD-10-CM | POA: Diagnosis not present

## 2019-11-21 DIAGNOSIS — E782 Mixed hyperlipidemia: Secondary | ICD-10-CM | POA: Diagnosis not present

## 2019-11-22 ENCOUNTER — Telehealth: Payer: Self-pay | Admitting: *Deleted

## 2019-11-22 NOTE — Telephone Encounter (Signed)
Spoke with patient regarding referral cancellation received from Dr. Dr. Sabra Heck at [facility] states he is a former patient of Dr. Wynonia Lawman and does not have elevated cholesterol or lipids.  He states he will call our office if he feels he needs to schedule.

## 2019-11-26 ENCOUNTER — Ambulatory Visit: Payer: PPO | Admitting: Cardiology

## 2020-01-01 DIAGNOSIS — E291 Testicular hypofunction: Secondary | ICD-10-CM | POA: Diagnosis not present

## 2020-01-01 DIAGNOSIS — R350 Frequency of micturition: Secondary | ICD-10-CM | POA: Diagnosis not present

## 2020-01-01 DIAGNOSIS — N411 Chronic prostatitis: Secondary | ICD-10-CM | POA: Diagnosis not present

## 2020-03-26 DIAGNOSIS — D2272 Melanocytic nevi of left lower limb, including hip: Secondary | ICD-10-CM | POA: Diagnosis not present

## 2020-03-26 DIAGNOSIS — D2271 Melanocytic nevi of right lower limb, including hip: Secondary | ICD-10-CM | POA: Diagnosis not present

## 2020-03-26 DIAGNOSIS — L570 Actinic keratosis: Secondary | ICD-10-CM | POA: Diagnosis not present

## 2020-03-26 DIAGNOSIS — D2261 Melanocytic nevi of right upper limb, including shoulder: Secondary | ICD-10-CM | POA: Diagnosis not present

## 2020-03-26 DIAGNOSIS — X32XXXA Exposure to sunlight, initial encounter: Secondary | ICD-10-CM | POA: Diagnosis not present

## 2020-03-26 DIAGNOSIS — D225 Melanocytic nevi of trunk: Secondary | ICD-10-CM | POA: Diagnosis not present

## 2020-03-26 DIAGNOSIS — D2262 Melanocytic nevi of left upper limb, including shoulder: Secondary | ICD-10-CM | POA: Diagnosis not present

## 2020-03-26 DIAGNOSIS — L814 Other melanin hyperpigmentation: Secondary | ICD-10-CM | POA: Diagnosis not present

## 2020-04-01 DIAGNOSIS — R5382 Chronic fatigue, unspecified: Secondary | ICD-10-CM | POA: Diagnosis not present

## 2020-04-01 DIAGNOSIS — E639 Nutritional deficiency, unspecified: Secondary | ICD-10-CM | POA: Diagnosis not present

## 2020-04-22 DIAGNOSIS — N5201 Erectile dysfunction due to arterial insufficiency: Secondary | ICD-10-CM | POA: Diagnosis not present

## 2020-04-22 DIAGNOSIS — Z79899 Other long term (current) drug therapy: Secondary | ICD-10-CM | POA: Diagnosis not present

## 2020-04-22 DIAGNOSIS — E291 Testicular hypofunction: Secondary | ICD-10-CM | POA: Diagnosis not present

## 2020-04-22 DIAGNOSIS — N401 Enlarged prostate with lower urinary tract symptoms: Secondary | ICD-10-CM | POA: Diagnosis not present

## 2020-05-11 ENCOUNTER — Encounter: Payer: Self-pay | Admitting: *Deleted

## 2020-05-11 ENCOUNTER — Telehealth: Payer: Self-pay | Admitting: *Deleted

## 2020-05-11 ENCOUNTER — Other Ambulatory Visit: Payer: Self-pay | Admitting: *Deleted

## 2020-05-11 NOTE — Telephone Encounter (Signed)
Left message for patient to call back. Dr Hilarie Fredrickson received a message that patient was interested in an appointment with our office so I was calling to facilitate that. I need to find out how we can help. I have availability currently on 05/26/20 at 930 am.

## 2020-05-12 NOTE — Telephone Encounter (Signed)
Patient indicates that he has been having some mid-chest discomfort recently and has history of GERD. Had endoscopy in the past with Dr Oletta Lamas at [facility] (now retired) and would like to now follow with Dr Hilarie Fredrickson. Patient does make note that his heart feels like it may be "a little out of rhythm" sometimes but that he has had this checked with cardiologist in the past and was told this was all reflux related. Dr Wynonia Lawman is patient's cardiologist so I am unable to confirm workup through [facility]. Patient is scheduled to see Dr Hilarie Fredrickson on 05/26/20.

## 2020-05-14 DIAGNOSIS — Z79899 Other long term (current) drug therapy: Secondary | ICD-10-CM | POA: Diagnosis not present

## 2020-05-14 DIAGNOSIS — N401 Enlarged prostate with lower urinary tract symptoms: Secondary | ICD-10-CM | POA: Diagnosis not present

## 2020-05-14 DIAGNOSIS — E291 Testicular hypofunction: Secondary | ICD-10-CM | POA: Diagnosis not present

## 2020-05-21 ENCOUNTER — Encounter: Payer: Self-pay | Admitting: Internal Medicine

## 2020-05-21 DIAGNOSIS — Z8679 Personal history of other diseases of the circulatory system: Secondary | ICD-10-CM | POA: Diagnosis not present

## 2020-05-21 DIAGNOSIS — Z713 Dietary counseling and surveillance: Secondary | ICD-10-CM | POA: Diagnosis not present

## 2020-05-21 NOTE — Telephone Encounter (Signed)
error 

## 2020-05-25 DIAGNOSIS — E291 Testicular hypofunction: Secondary | ICD-10-CM | POA: Diagnosis not present

## 2020-05-25 DIAGNOSIS — N5201 Erectile dysfunction due to arterial insufficiency: Secondary | ICD-10-CM | POA: Diagnosis not present

## 2020-05-25 DIAGNOSIS — R972 Elevated prostate specific antigen [PSA]: Secondary | ICD-10-CM | POA: Diagnosis not present

## 2020-05-26 ENCOUNTER — Ambulatory Visit: Payer: PPO | Admitting: Internal Medicine

## 2020-05-26 ENCOUNTER — Encounter: Payer: Self-pay | Admitting: Internal Medicine

## 2020-05-26 VITALS — BP 128/74 | HR 64 | Ht 67.0 in | Wt 175.0 lb

## 2020-05-26 DIAGNOSIS — K219 Gastro-esophageal reflux disease without esophagitis: Secondary | ICD-10-CM

## 2020-05-26 DIAGNOSIS — R072 Precordial pain: Secondary | ICD-10-CM | POA: Diagnosis not present

## 2020-05-26 DIAGNOSIS — Z86010 Personal history of colonic polyps: Secondary | ICD-10-CM

## 2020-05-26 DIAGNOSIS — K449 Diaphragmatic hernia without obstruction or gangrene: Secondary | ICD-10-CM | POA: Diagnosis not present

## 2020-05-26 MED ORDER — ESOMEPRAZOLE MAGNESIUM 40 MG PO CPDR: 40.0000 mg | DELAYED_RELEASE_CAPSULE | Freq: Two times a day (BID) | ORAL | 3 refills | Status: DC

## 2020-05-26 NOTE — Progress Notes (Signed)
Patient ID: Darryl King, male   DOB: 1951/09/03, 69 y.o.   MRN: [id number] HPI: Darryl King is a 69 year old male with a past medical history of GERD, hiatal hernia, adenomatous colon polyp, hyperlipidemia, low testosterone, B12 deficiency who is seen to evaluate GERD and substernal chest pain.  He is here alone today.  He had a GI history with Dr. Leonie Douglas at [facility].  His most recent evaluation was for upper endoscopy and colonoscopy which was performed on 10/16/2017. I have portions of this report and we have requested the entirety of the report and pathology records EGD: Medium-sized hiatal hernia, 2 gastric polyps biopsied, normal examined duodenum Colonoscopy: 1 5 mm polyp in the descending colon removed with cold snare, nonbleeding internal hemorrhoids  He reports that in July 2021 he developed substernal chest pains which were fairly constant but have since slowly improved.  He had a similar episode 10 to 15 years ago of substernal chest pain but at that time was associated with palpitations.  He was seen by Dr. Wynonia Lawman of cardiology and underwent stress testing which was normal and PFTs which were normal.  Very similar symptoms this July which were rather constant in nature though have improved slowly.  He notes that he also started jogging around this time.  Not much heartburn, no dysphagia or odynophagia.  Was associated with some hoarseness and throat clearing.  Had been taking Nexium but switched to pantoprazole which did not seem to work as well.  He has since gone back to Nexium 40 mg daily and famotidine 40 mg in the evening.  With this therapy symptoms have improved 70 or 80%.  He remains active and is not having shortness of breath.  His bowel movements are regular and he eats healthily and also drinks protein shakes with flaxseed.  No blood in stool or melena.  He has an appointment with Dr. Percival Spanish with cardiology on 06/12/2020  Past Medical History:  Diagnosis Date  . B12  deficiency   . Colon polyp   . Diverticulosis   . Hyperlipidemia   . Laryngopharyngeal reflux   . Low testosterone   . Tubular adenoma of colon 09/2017   x 1; Dr Alferd [facility]      Outpatient Medications Prior to Visit  Medication Sig Dispense Refill  . Cholecalciferol 50 MCG (2000 UT) TABS Take 1 tablet by mouth daily.    . cyanocobalamin 1000 MCG tablet Take 1 tablet by mouth daily.    . famotidine (PEPCID) 40 MG tablet Take 40 mg by mouth at bedtime.     . meloxicam (MOBIC) 15 MG tablet as needed.     . Multiple Vitamin (MULTI-VITAMIN) tablet Take 1 tablet by mouth daily.    . temazepam (RESTORIL) 30 MG capsule Take 15 mg by mouth at bedtime as needed.     Marland Kitchen esomeprazole (NEXIUM) 40 MG capsule Take 40 mg by mouth daily.    . pantoprazole (PROTONIX) 40 MG tablet Take 1 tablet by mouth daily.     No facility-administered medications prior to visit.    No Known Allergies  Family History  Problem Relation Age of Onset  . Ulcers Mother   . Colon polyps Father     Social History   Tobacco Use  . Smoking status: Never Smoker  . Smokeless tobacco: Never Used  Vaping Use  . Vaping Use: Never used  Substance Use Topics  . Alcohol use: Yes    Comment: 1-2 drink some nights  .  Drug use: Never    ROS: As per history of present illness, otherwise negative  BP 128/74   Pulse 64   Ht 5\' 7"  (1.702 m)   Wt 175 lb (79.4 kg)   BMI 27.41 kg/m  Constitutional: Well-developed and well-nourished. No distress. HEENT: Normocephalic and atraumatic.  Conjunctivae are normal.  No scleral icterus. Neck: Neck supple. Trachea midline. Cardiovascular: Normal rate, regular rhythm and intact distal pulses. No M/R/G Pulmonary/chest: Effort normal and breath sounds normal. No wheezing, rales or rhonchi. Abdominal: Soft, nontender, nondistended. Bowel sounds active throughout. There are no masses palpable. No hepatosplenomegaly. Extremities: no clubbing, cyanosis, or  edema Neurological: Alert and oriented to person place and time. Skin: Skin is warm and dry.  Psychiatric: Normal mood and affect. Behavior is normal.  ASSESSMENT/PLAN: 69 year old male with a past medical history of GERD, hiatal hernia, adenomatous colon polyp, hyperlipidemia, low testosterone, B12 deficiency who is seen to evaluate GERD and substernal chest pain.  1. Substernal chest pain/GERD --we discussed his symptoms today and I agree completely with cardiology evaluation which is scheduled in approximately 2 weeks.  While the symptoms may be entirely GERD related I feel it is very important to rule out cardiac chest pain.  Symptoms have improved and given the constant nature during the most severe times this would not sound cardiac.  It is quite possible that he developed reflux related esophagitis which is now healing.  I recommended the following: --Complete cardiac evaluation to exclude cardiac etiology --Increase Nexium to 40 mg twice daily AC for 6 weeks and if symptoms normalize completely return to Nexium once daily with famotidine 40 mg in the evening --If cardiac evaluation negative and symptoms persist I would recommend we repeat EGD --He asked about surgical therapy for heartburn and reflux disease so that he may be able to stop chronic medical therapy.  We spent time today discussing Nissen fundoplication, TIF, and hiatal hernia repair plus TIF.  Which surgical versus endoscopic therapy would be determined by assessing the size of hiatal hernia as TIF alone is an option for patients with small hiatal hernias that are less than 2 cm.  We will proceed as above before further deciding about surgical or endoscopic therapy for chronic reflux  2.  History of adenomatous colon polyp --surveillance colonoscopy recommended February 2024      OZ:HYQMVH, Christean Grief, Md [address] [facility] [place],  [place] [id number]

## 2020-05-26 NOTE — Patient Instructions (Addendum)
We have sent the following medications to your pharmacy for you to pick up at your convenience: Nexium 40 mg twice daily before meals x 6 weeks, then decrease to once daily dosing.  We will request records from [facility].  You may take Pepcid 40 mg every night as needed while taking Nexium twice daily. AFTER returning to Nexium once daily, you may start taking Pepcid 40 mg daily again.  Follow up with Dr Hilarie Fredrickson on 08/03/20 at 2:30 pm.  If you are age 65 or older, your body mass index should be between 23-30. Your Body mass index is 27.41 kg/m. If this is out of the aforementioned range listed, please consider follow up with your Primary Care Provider.  If you are age 74 or younger, your body mass index should be between 19-25. Your Body mass index is 27.41 kg/m. If this is out of the aformentioned range listed, please consider follow up with your Primary Care Provider.   Due to recent changes in healthcare laws, you may see the results of your imaging and laboratory studies on MyChart before your provider has had a chance to review them.  We understand that in some cases there may be results that are confusing or concerning to you. Not all laboratory results come back in the same time frame and the provider may be waiting for multiple results in order to interpret others.  Please give [place] 48 hours in order for your provider to thoroughly review all the results before contacting the office for clarification of your results.

## 2020-06-02 ENCOUNTER — Telehealth: Payer: Self-pay | Admitting: Internal Medicine

## 2020-06-02 NOTE — Telephone Encounter (Signed)
[facility] received 42 pages from [employer]. Sending by interoffice mail to [facility] 06/02/20  KLM

## 2020-06-11 DIAGNOSIS — Z7189 Other specified counseling: Secondary | ICD-10-CM | POA: Insufficient documentation

## 2020-06-11 DIAGNOSIS — R072 Precordial pain: Secondary | ICD-10-CM | POA: Insufficient documentation

## 2020-06-11 DIAGNOSIS — R002 Palpitations: Secondary | ICD-10-CM | POA: Insufficient documentation

## 2020-06-11 NOTE — Progress Notes (Signed)
  Cardiology Office Note   Date:  06/12/2020   ID:  Waleed G Sachdeva, DOB 08/17/1951, MRN [id number]  PCP:  Miller, Mark F, MD  Cardiologist:   James Hochrein, MD Referring:  Miller, Mark F, MD  Chief Complaint  Patient presents with  . Chest Pain      History of Present Illness: Darryl King is a 69 y.o. male who presents for evaluation of chest pain and palpitations.  He is referred by Miller, Mark F, MD.  He has no past cardiac history other than seeing Dr. Tilley many years ago for some fluttering and atypical pain and he had a negative treadmill test by his description.  He has had some acid reflux.  However, the symptoms he is currently having he thinks are different than that.  In July he was feeling pretty good and he is always been an exerciser.  He started to try to run a little bit.  He noticed that after he was done running not during he would get a deep burning in his chest.  He had some fluttering in his chest and up into his neck.  This discomfort would be moderate in intensity.  He was treated with Nexium and he did see Dr. Pyrtle.  He thinks he might be getting slightly better.  However, he stopped running because of this discomfort.  He was concerned enough about the discomfort because it was more intense.  He will be a little bit short of breath with it.  He did not think it was like his previous reflux.  He was not describing neck or arm discomfort.  Is not been having any PND or orthopnea.  He is not been having any weight gain or edema.   Past Medical History:  Diagnosis Date  . B12 deficiency   . Colon polyp   . Diverticulosis   . Hyperlipidemia   . Laryngopharyngeal reflux   . Low testosterone   . Tubular adenoma of colon 09/2017    Past Surgical History:  Procedure Laterality Date  . BACK SURGERY       Current Outpatient Medications  Medication Sig Dispense Refill  . Cholecalciferol 50 MCG (2000 UT) TABS Take 1 tablet by mouth daily.    .  cyanocobalamin 1000 MCG tablet Take 1 tablet by mouth daily.    . esomeprazole (NEXIUM) 40 MG capsule Take 1 capsule (40 mg total) by mouth 2 (two) times daily before a meal. 180 capsule 3  . famotidine (PEPCID) 40 MG tablet Take 40 mg by mouth at bedtime.     . meloxicam (MOBIC) 15 MG tablet as needed.     . Multiple Vitamin (MULTI-VITAMIN) tablet Take 1 tablet by mouth daily.    . temazepam (RESTORIL) 30 MG capsule Take 15 mg by mouth at bedtime as needed.     . Testosterone Compounding Kit 20 % CREA Place onto the skin.     No current facility-administered medications for this visit.    Allergies:   Patient has no known allergies.    Social History:  The patient  reports that he has never smoked. He has never used smokeless tobacco. He reports current alcohol use. He reports that he does not use drugs.   Family History:  The patient's family history includes Colon polyps in his father; Ulcers in his mother.    ROS:  Please see the history of present illness.   Otherwise, review of systems are positive for none.     All other systems are reviewed and negative.    PHYSICAL EXAM: VS:  BP (!) 144/80   Pulse (!) 59   Ht 5' 7" (1.702 m)   Wt 174 lb 9.6 oz (79.2 kg)   BMI 27.35 kg/m  , BMI Body mass index is 27.35 kg/m. GENERAL:  Well appearing HEENT:  Pupils equal round and reactive, fundi not visualized, oral mucosa unremarkable NECK:  No jugular venous distention, waveform within normal limits, carotid upstroke brisk and symmetric, no bruits, no thyromegaly LYMPHATICS:  No cervical, inguinal adenopathy LUNGS:  Clear to auscultation bilaterally BACK:  No CVA tenderness CHEST:  Unremarkable HEART:  PMI not displaced or sustained,S1 and S2 within normal limits, no S3, no S4, no clicks, no rubs, no murmurs ABD:  Flat, positive bowel sounds normal in frequency in pitch, no bruits, no rebound, no guarding, no midline pulsatile mass, no hepatomegaly, no splenomegaly EXT:  2 plus pulses  throughout, no edema, no cyanosis no clubbing SKIN:  No rashes no nodules NEURO:  Cranial nerves II through XII grossly intact, motor grossly intact throughout PSYCH:  Cognitively intact, oriented to person place and time    EKG:  EKG is ordered today. The ekg ordered today demonstrates sinus rhythm, rate 59, axis within normal limits, intervals within normal limits, poor anterior R wave progression, no acute ST-T wave changes.   Recent Labs: 06/14/2019: Creatinine, Ser 0.90    Lipid Panel No results found for: CHOL, TRIG, HDL, CHOLHDL, VLDL, LDLCALC, LDLDIRECT    Wt Readings from Last 3 Encounters:  06/12/20 174 lb 9.6 oz (79.2 kg)  05/26/20 175 lb (79.4 kg)      Other studies Reviewed: Additional studies/ records that were reviewed today include: Labs. Review of the above records demonstrates:  Please see elsewhere in the note.     ASSESSMENT AND PLAN:  CHEST PAIN: The patient's chest pain is somewhat atypical but does have some typical worrisome features.  I think the pretest probability of obstructive coronary disease is at least moderate.  I am going to send him for an exercise perfusion study.  Further evaluation will be based on these results.  PALPITATIONS: Palpitations are not particularly symptomatic.  No change in therapy.  RISK REDUCTION: He has an excellent lipid profile.  His blood pressure slightly elevated today but this is very unusual and he checks it routinely.  He eats well and exercises routinely.  I am not suggesting any change in therapy at this point.   Current medicines are reviewed at length with the patient today.  The patient does not have concerns regarding medicines.  The following changes have been made:  no change  Labs/ tests ordered today include:   Orders Placed This Encounter  Procedures  . MYOCARDIAL PERFUSION IMAGING  . EKG 12-Lead     Disposition:   FU with as needed     Signed, Minus Breeding, MD  06/12/2020 10:23 AM      [facility]

## 2020-06-12 ENCOUNTER — Ambulatory Visit (INDEPENDENT_AMBULATORY_CARE_PROVIDER_SITE_OTHER): Payer: PPO | Admitting: Cardiology

## 2020-06-12 ENCOUNTER — Encounter: Payer: Self-pay | Admitting: Cardiology

## 2020-06-12 ENCOUNTER — Other Ambulatory Visit: Payer: Self-pay

## 2020-06-12 ENCOUNTER — Telehealth: Payer: Self-pay | Admitting: Cardiology

## 2020-06-12 VITALS — BP 144/80 | HR 59 | Ht 67.0 in | Wt 174.6 lb

## 2020-06-12 DIAGNOSIS — R002 Palpitations: Secondary | ICD-10-CM | POA: Diagnosis not present

## 2020-06-12 DIAGNOSIS — R072 Precordial pain: Secondary | ICD-10-CM

## 2020-06-12 NOTE — Patient Instructions (Signed)
Medication Instructions:  No change  *If you need a refill on your cardiac medications before your next appointment, please call your pharmacy*   Lab Work: Not needed   Testing/Procedures: Will be schedule  At [facility] has requested that you have en exercise stress myoview. Please follow instruction sheet, as given.    Follow-Up: At [facility], you and your health needs are our priority.  As part of our continuing mission to provide you with exceptional heart care, we have created designated Provider Care Teams.  These Care Teams include your primary Cardiologist (physician) and Advanced Practice Providers (APPs -  Physician Assistants and Nurse Practitioners) who all work together to provide you with the care you need, when you need it.  We recommend signing up for the patient portal called "MyChart".  Sign up information is provided on this After Visit Summary.  MyChart is used to connect with patients for Virtual Visits (Telemedicine).  Patients are able to view lab/test results, encounter notes, upcoming appointments, etc.  Non-urgent messages can be sent to your provider as well.   To learn more about what you can do with MyChart, go to [email or web address].    Your next appointment:   3 month(s) ( if test is negative , can be seen as needed)   The format for your next appointment:   In Person  Provider:   Minus Breeding, MD

## 2020-06-12 NOTE — Telephone Encounter (Signed)
Spoke with Darryl King regarding appointment for COVID prescreening scheduled Wednesday 06/17/20 at 9:15 am at [facility] Entr[facility] at [facility] [address].---THIS [place].  She voiced her understanding.Marland Kitchen

## 2020-06-16 ENCOUNTER — Telehealth: Payer: Self-pay | Admitting: Cardiology

## 2020-06-16 NOTE — Telephone Encounter (Signed)
Patient would like to have the COVID test done at the [facility] after 1:00 pm

## 2020-06-16 NOTE — Telephone Encounter (Signed)
See below.Patient needs COVID testing rescheduled. Thank you!

## 2020-06-16 NOTE — Telephone Encounter (Signed)
Patient needs to reschedule his COVID test for his upcoming Stress Test. The patient was scheduled but his wife got COVID and he had to call his wife [phone]

## 2020-06-17 ENCOUNTER — Other Ambulatory Visit: Payer: PPO

## 2020-06-17 ENCOUNTER — Other Ambulatory Visit: Admission: RE | Admit: 2020-06-17 | Payer: PPO | Source: Ambulatory Visit

## 2020-06-17 NOTE — Telephone Encounter (Signed)
Spoke with the patient who has rescheduled his stress test for 10/29 so he needs COVID screening rescheduled as well. I have rescheduled testing for 10/27

## 2020-06-19 ENCOUNTER — Ambulatory Visit (HOSPITAL_COMMUNITY)
Admission: RE | Admit: 2020-06-19 | Payer: PPO | Source: Ambulatory Visit | Attending: Cardiology | Admitting: Cardiology

## 2020-06-20 DIAGNOSIS — Z20822 Contact with and (suspected) exposure to covid-19: Secondary | ICD-10-CM | POA: Diagnosis not present

## 2020-07-08 ENCOUNTER — Other Ambulatory Visit
Admission: RE | Admit: 2020-07-08 | Discharge: 2020-07-08 | Disposition: A | Discharge status: Home / Self Care | Payer: PPO | Source: Ambulatory Visit | Attending: Cardiology | Admitting: Cardiology

## 2020-07-08 ENCOUNTER — Other Ambulatory Visit: Payer: Self-pay

## 2020-07-08 DIAGNOSIS — Z20822 Contact with and (suspected) exposure to covid-19: Secondary | ICD-10-CM | POA: Insufficient documentation

## 2020-07-08 DIAGNOSIS — Z01812 Encounter for preprocedural laboratory examination: Secondary | ICD-10-CM | POA: Insufficient documentation

## 2020-07-08 LAB — SARS CORONAVIRUS 2 (TAT 6-24 HRS): SARS Coronavirus 2: NEGATIVE

## 2020-07-10 ENCOUNTER — Ambulatory Visit (HOSPITAL_COMMUNITY)
Admission: RE | Admit: 2020-07-10 | Discharge: 2020-07-10 | Disposition: A | Discharge status: Home / Self Care | Payer: PPO | Source: Ambulatory Visit | Attending: Internal Medicine | Admitting: Internal Medicine

## 2020-07-10 ENCOUNTER — Telehealth: Payer: Self-pay | Admitting: Internal Medicine

## 2020-07-10 ENCOUNTER — Other Ambulatory Visit: Payer: Self-pay

## 2020-07-10 DIAGNOSIS — R072 Precordial pain: Secondary | ICD-10-CM | POA: Diagnosis not present

## 2020-07-10 LAB — MYOCARDIAL PERFUSION IMAGING
Estimated workload: 10.1 METS
Exercise duration (min): 8 min
Exercise duration (sec): 0 s
LV dias vol: 96 mL (ref 62–150)
LV sys vol: 35 mL
MPHR: 149 {beats}/min
Peak HR: 146 {beats}/min
Percent HR: 97 %
Rest HR: 59 {beats}/min
SDS: 1
SRS: 1
SSS: 2
TID: 1.03

## 2020-07-10 MED ORDER — TECHNETIUM TC 99M TETROFOSMIN IV KIT
9.8000 | PACK | Freq: Once | INTRAVENOUS | Status: AC | PRN
  Administered 2020-07-10: 9.8 via INTRAVENOUS
  Filled 2020-07-10: qty 10

## 2020-07-10 MED ORDER — REGADENOSON 0.4 MG/5ML IV SOLN
0.4000 mg | Freq: Once | INTRAVENOUS | Status: DC
Start: 2020-07-10 — End: 2020-07-10

## 2020-07-10 MED ORDER — TECHNETIUM TC 99M TETROFOSMIN IV KIT
30.9000 | PACK | Freq: Once | INTRAVENOUS | Status: AC | PRN
  Administered 2020-07-10: 30.9 via INTRAVENOUS
  Filled 2020-07-10: qty 31

## 2020-07-10 NOTE — Telephone Encounter (Signed)
Spoke to patient he is feeling OK  Does notice skips at times  "That's why I went to see Dr Percival Spanish" Told him I reviewed test   Had some EKG changes and skipds   But pictures looked OK    Recomm:  Take activiites as tolerated until he hears back from Dr Percival Spanish Take ecASA 81 mg until then  Dorris Carnes MD

## 2020-07-11 NOTE — Telephone Encounter (Signed)
There were no high risk findings on this study.  Images did not indicate an obstructive coronary disease.  I would like to see him back in the next couple of weeks to explore the symptoms.  No further testing is indicated from this test.

## 2020-07-13 NOTE — Telephone Encounter (Signed)
Scheduled patient for 11/19 with Dr Percival Spanish, no appointments this week and he is not in office next week

## 2020-07-30 NOTE — Progress Notes (Signed)
Cardiology Office Note   Date:  07/31/2020   ID:  Darryl King, DOB Jul 01, 1951, MRN [id number]  PCP:  Rusty Aus, MD  Cardiologist:   Minus Breeding, MD Referring:  Rusty Aus, MD  Chief Complaint  Patient presents with  . Chest Pain      History of Present Illness: Darryl King is a 69 y.o. male who presents for evaluation of chest pain and palpitations.   He had no evidence of ischemia on images although there was some ST depression on the EKG and it was interpreted as an intermediate risk study.  I brought him back today to discuss symptoms.  He actually has no further symptoms.  He is taking the Nexium twice daily.  He thinks this was all similar to previous problems he has had with esophageal inflammation.  He has changed some of his intake of alcohol and food and it seems to have helped along with the medications.  He is denying any chest pain.  He is not having any palpitations, presyncope or syncope.  He is not having any shortness of breath, PND or orthopnea.  Has had no weight gain or edema.  He is anxious to get back to physical activity.    Past Medical History:  Diagnosis Date  . B12 deficiency   . Colon polyp   . Diverticulosis   . Hyperlipidemia   . Laryngopharyngeal reflux   . Low testosterone   . Tubular adenoma of colon 09/2017    Past Surgical History:  Procedure Laterality Date  . BACK SURGERY       Current Outpatient Medications  Medication Sig Dispense Refill  . Cholecalciferol 50 MCG (2000 UT) TABS Take 1 tablet by mouth daily.    . cyanocobalamin 1000 MCG tablet Take 1 tablet by mouth daily.    Marland Kitchen esomeprazole (NEXIUM) 40 MG capsule Take 1 capsule (40 mg total) by mouth 2 (two) times daily before a meal. 180 capsule 3  . famotidine (PEPCID) 40 MG tablet Take 40 mg by mouth at bedtime.     . meloxicam (MOBIC) 15 MG tablet as needed.     . Multiple Vitamin (MULTI-VITAMIN) tablet Take 1 tablet by mouth daily.    . temazepam  (RESTORIL) 30 MG capsule Take 15 mg by mouth at bedtime as needed.     . Testosterone Compounding Kit 20 % CREA Place onto the skin.     No current facility-administered medications for this visit.    Allergies:   Patient has no known allergies.   ROS:  Please see the history of present illness.   Otherwise, review of systems are positive for none.   All other systems are reviewed and negative.    PHYSICAL EXAM: VS:  BP 128/82   Pulse 64   Ht _0  (1.702 m)   Wt 171 lb 9.6 oz (77.8 kg)   BMI 26.88 kg/m  , BMI Body mass index is 26.88 kg/m. GENERAL:  Well appearing NECK:  No jugular venous distention, waveform within normal limits, carotid upstroke brisk and symmetric, no bruits, no thyromegaly LUNGS:  Clear to auscultation bilaterally CHEST:  Unremarkable HEART:  PMI not displaced or sustained,S1 and S2 within normal limits, no S3, no S4, no clicks, no rubs, no murmurs ABD:  Flat, positive bowel sounds normal in frequency in pitch, no bruits, no rebound, no guarding, no midline pulsatile mass, no hepatomegaly, no splenomegaly EXT:  2 plus pulses throughout, no edema, no  cyanosis no clubbing   EKG:  EKG is not ordered today.    Recent Labs: No results found for requested labs within last 8760 hours.    Lipid Panel No results found for: CHOL, TRIG, HDL, CHOLHDL, VLDL, LDLCALC, LDLDIRECT    Wt Readings from Last 3 Encounters:  07/31/20 171 lb 9.6 oz (77.8 kg)  07/10/20 174 lb (78.9 kg)  06/12/20 174 lb 9.6 oz (79.2 kg)      Other studies Reviewed: Additional studies/ records that were reviewed today include: Exercise perfusion study. Review of the above records demonstrates:  Please see elsewhere in the note.     ASSESSMENT AND PLAN:  CHEST PAIN:   His chest pain has resolved.  He had normal images on his perfusion study.   I did review these and there was some borderline ST change in some ventricular ectopy.  However, there were no high risk features.  I do not  think further cardiovascular testing is suggested based on these results and he can slowly resume his physical activity and let me know if symptoms recur.   PALPITATIONS: He is no longer having these.  No change in therapy.     Current medicines are reviewed at length with the patient today.  The patient does not have concerns regarding medicines.  The following changes have been made:  no change  Labs/ tests ordered today include:   No orders of the defined types were placed in this encounter.    Disposition:   FU with as needed     Signed, Minus Breeding, MD  07/31/2020 11:43 AM    [place]

## 2020-07-31 ENCOUNTER — Encounter: Payer: Self-pay | Admitting: Cardiology

## 2020-07-31 ENCOUNTER — Ambulatory Visit: Payer: PPO | Admitting: Cardiology

## 2020-07-31 ENCOUNTER — Other Ambulatory Visit: Payer: Self-pay

## 2020-07-31 VITALS — BP 128/82 | HR 64 | Ht 67.0 in | Wt 171.6 lb

## 2020-07-31 DIAGNOSIS — R002 Palpitations: Secondary | ICD-10-CM | POA: Diagnosis not present

## 2020-07-31 DIAGNOSIS — R072 Precordial pain: Secondary | ICD-10-CM

## 2020-07-31 NOTE — Patient Instructions (Signed)
Medication Instructions:  No changes *If you need a refill on your cardiac medications before your next appointment, please call your pharmacy*  Lab Work: None ordered this visit  Testing/Procedures: None ordered this visit  Follow-Up: At [facility], you and your health needs are our priority.  As part of our continuing mission to provide you with exceptional heart care, we have created designated Provider Care Teams.  These Care Teams include your primary Cardiologist (physician) and Advanced Practice Providers (APPs -  Physician Assistants and Nurse Practitioners) who all work together to provide you with the care you need, when you need it.  We recommend signing up for the patient portal called "MyChart".  Sign up information is provided on this After Visit Summary.  MyChart is used to connect with patients for Virtual Visits (Telemedicine).  Patients are able to view lab/test results, encounter notes, upcoming appointments, etc.  Non-urgent messages can be sent to your provider as well.   To learn more about what you can do with MyChart, go to [email or web address].    Your next appointment:   Follow up as needed

## 2020-08-03 ENCOUNTER — Encounter: Payer: Self-pay | Admitting: Internal Medicine

## 2020-08-03 ENCOUNTER — Other Ambulatory Visit: Payer: Self-pay

## 2020-08-03 ENCOUNTER — Ambulatory Visit (INDEPENDENT_AMBULATORY_CARE_PROVIDER_SITE_OTHER)
Admission: RE | Admit: 2020-08-03 | Discharge: 2020-08-03 | Disposition: A | Discharge status: Home / Self Care | Payer: PPO | Source: Ambulatory Visit | Attending: Internal Medicine | Admitting: Internal Medicine

## 2020-08-03 ENCOUNTER — Ambulatory Visit: Payer: PPO | Admitting: Internal Medicine

## 2020-08-03 VITALS — BP 124/84 | HR 76 | Ht 67.0 in | Wt 170.6 lb

## 2020-08-03 DIAGNOSIS — K219 Gastro-esophageal reflux disease without esophagitis: Secondary | ICD-10-CM

## 2020-08-03 DIAGNOSIS — R0789 Other chest pain: Secondary | ICD-10-CM

## 2020-08-03 DIAGNOSIS — R079 Chest pain, unspecified: Secondary | ICD-10-CM | POA: Diagnosis not present

## 2020-08-03 MED ORDER — DEXILANT 60 MG PO CPDR: 60.0000 mg | DELAYED_RELEASE_CAPSULE | Freq: Every day | ORAL | 0 refills | Status: DC

## 2020-08-03 NOTE — Patient Instructions (Signed)
Discontinue Nexium.  We have sent the following medications to your pharmacy for you to pick up at your convenience: Dexilant 60 mg daily.  Your provider has requested that you have an chest x ray before leaving today. Please go to the basement floor to our Radiology department for the test.  If you are age 71 or older, your body mass index should be between 23-30. Your Body mass index is 26.72 kg/m. If this is out of the aforementioned range listed, please consider follow up with your Primary Care Provider.  If you are age 56 or younger, your body mass index should be between 19-25. Your Body mass index is 26.72 kg/m. If this is out of the aformentioned range listed, please consider follow up with your Primary Care Provider.   Due to recent changes in healthcare laws, you may see the results of your imaging and laboratory studies on MyChart before your provider has had a chance to review them.  We understand that in some cases there may be results that are confusing or concerning to you. Not all laboratory results come back in the same time frame and the provider may be waiting for multiple results in order to interpret others.  Please give [place] 48 hours in order for your provider to thoroughly review all the results before contacting the office for clarification of your results.

## 2020-08-03 NOTE — Progress Notes (Signed)
° °  Subjective:    Patient ID: Darryl King, male    DOB: 07-06-1951, 69 y.o.   MRN: [id number]  HPI Darryl King is a 69 year old male with a history of GERD, hiatal hernia, adenomatous colon polyps, hyperlipidemia, B12 deficiency, low testosterone who is here for follow-up.  He is here alone today and I last saw him on 05/26/2020.  At his last visit we increased his Nexium to 40 mg twice daily for substernal chest pain and GERD.  He also completed a cardiology evaluation including a nuclear stress test with Dr. Percival Spanish.  This was low risk and did not show any concerning findings of CAD.  He reports that his palpitations have completely calmed down and his substernal chest pain is significantly better.  Still there occasionally and a very mild form but again much much better.  He is also had improvement in his throat clearing and hoarseness symptoms.  He has changed his diet and is not eating past 7 PM.  He is also cut out wine which he previously enjoyed with his wife.  He sees integrative health in [place] and they suggested that perhaps his palpitations related to his GERD by way of the vagus nerve and its proximity to esophagus.  At times he has a hard time remembering the second dose of his Nexium  Review of Systems As per HPI, otherwise negative  Current Medications, Allergies, Past Medical History, Past Surgical History, Family History and Social History were reviewed in [employer] record.     Objective:   Physical Exam BP 124/84 (BP Location: Left Arm, Patient Position: Sitting)    Pulse 76    Ht 5\' 7"  (1.702 m)    Wt 170 lb 9.6 oz (77.4 kg)    SpO2 97%    BMI 26.72 kg/m  Gen: awake, alert, NAD HEENT: anicteric Ext: no c/c/e Neuro: nonfocal      Assessment & Plan:  69 year old male with a history of GERD, hiatal hernia, adenomatous colon polyps, hyperlipidemia, B12 deficiency, low testosterone who is here for follow-up.  1.  GERD/atypical chest  pain --it is likely that his atypical chest pain was secondary to GERD and probable esophagitis.  This has improved significantly.  Hiatal hernia makes his GERD more likely.  He is having at times difficulty with the second daily dose of Nexium and so we will try him on Dexilant 60 mg daily which is longer acting and only once daily dosing.  If symptoms recur then I would recommend we repeat upper endoscopy --Discontinue Nexium --Begin Dexilant 60 mg daily; 15 days of samples provided today --Chest x-ray for completeness --Upper endoscopy if symptoms recur  2.  History of adenomatous colon polyp --surveillance colonoscopy recommended February 2024   20 minutes total spent today including patient facing time, coordination of care, reviewing medical history/procedures/pertinent radiology studies, and documentation of the encounter.

## 2020-08-05 ENCOUNTER — Telehealth: Payer: Self-pay | Admitting: Internal Medicine

## 2020-08-05 NOTE — Telephone Encounter (Signed)
See result  Note.

## 2020-09-14 ENCOUNTER — Ambulatory Visit: Payer: PPO | Admitting: Cardiology

## 2020-09-30 DIAGNOSIS — Z08 Encounter for follow-up examination after completed treatment for malignant neoplasm: Secondary | ICD-10-CM | POA: Diagnosis not present

## 2020-09-30 DIAGNOSIS — D225 Melanocytic nevi of trunk: Secondary | ICD-10-CM | POA: Diagnosis not present

## 2020-09-30 DIAGNOSIS — L570 Actinic keratosis: Secondary | ICD-10-CM | POA: Diagnosis not present

## 2020-09-30 DIAGNOSIS — X32XXXA Exposure to sunlight, initial encounter: Secondary | ICD-10-CM | POA: Diagnosis not present

## 2020-09-30 DIAGNOSIS — D2261 Melanocytic nevi of right upper limb, including shoulder: Secondary | ICD-10-CM | POA: Diagnosis not present

## 2020-09-30 DIAGNOSIS — Z85828 Personal history of other malignant neoplasm of skin: Secondary | ICD-10-CM | POA: Diagnosis not present

## 2020-09-30 DIAGNOSIS — D2262 Melanocytic nevi of left upper limb, including shoulder: Secondary | ICD-10-CM | POA: Diagnosis not present

## 2020-11-05 DIAGNOSIS — K116 Mucocele of salivary gland: Secondary | ICD-10-CM | POA: Diagnosis not present

## 2020-11-05 DIAGNOSIS — K219 Gastro-esophageal reflux disease without esophagitis: Secondary | ICD-10-CM | POA: Diagnosis not present

## 2020-11-17 DIAGNOSIS — E538 Deficiency of other specified B group vitamins: Secondary | ICD-10-CM | POA: Diagnosis not present

## 2020-11-17 DIAGNOSIS — E782 Mixed hyperlipidemia: Secondary | ICD-10-CM | POA: Diagnosis not present

## 2020-11-17 DIAGNOSIS — Z125 Encounter for screening for malignant neoplasm of prostate: Secondary | ICD-10-CM | POA: Diagnosis not present

## 2020-11-23 DIAGNOSIS — Z79899 Other long term (current) drug therapy: Secondary | ICD-10-CM | POA: Diagnosis not present

## 2020-11-23 DIAGNOSIS — R972 Elevated prostate specific antigen [PSA]: Secondary | ICD-10-CM | POA: Diagnosis not present

## 2020-11-23 DIAGNOSIS — N401 Enlarged prostate with lower urinary tract symptoms: Secondary | ICD-10-CM | POA: Diagnosis not present

## 2020-11-23 DIAGNOSIS — E291 Testicular hypofunction: Secondary | ICD-10-CM | POA: Diagnosis not present

## 2020-11-24 DIAGNOSIS — E782 Mixed hyperlipidemia: Secondary | ICD-10-CM | POA: Diagnosis not present

## 2020-11-24 DIAGNOSIS — Z125 Encounter for screening for malignant neoplasm of prostate: Secondary | ICD-10-CM | POA: Diagnosis not present

## 2020-11-24 DIAGNOSIS — Z0000 Encounter for general adult medical examination without abnormal findings: Secondary | ICD-10-CM | POA: Diagnosis not present

## 2020-11-24 DIAGNOSIS — Z23 Encounter for immunization: Secondary | ICD-10-CM | POA: Diagnosis not present

## 2021-01-25 DIAGNOSIS — N5201 Erectile dysfunction due to arterial insufficiency: Secondary | ICD-10-CM | POA: Diagnosis not present

## 2021-01-25 DIAGNOSIS — N401 Enlarged prostate with lower urinary tract symptoms: Secondary | ICD-10-CM | POA: Diagnosis not present

## 2021-01-25 DIAGNOSIS — E291 Testicular hypofunction: Secondary | ICD-10-CM | POA: Diagnosis not present

## 2021-01-25 DIAGNOSIS — R972 Elevated prostate specific antigen [PSA]: Secondary | ICD-10-CM | POA: Diagnosis not present

## 2021-03-29 DIAGNOSIS — M5116 Intervertebral disc disorders with radiculopathy, lumbar region: Secondary | ICD-10-CM | POA: Diagnosis not present

## 2021-04-01 DIAGNOSIS — Z85828 Personal history of other malignant neoplasm of skin: Secondary | ICD-10-CM | POA: Diagnosis not present

## 2021-04-01 DIAGNOSIS — D225 Melanocytic nevi of trunk: Secondary | ICD-10-CM | POA: Diagnosis not present

## 2021-04-01 DIAGNOSIS — L570 Actinic keratosis: Secondary | ICD-10-CM | POA: Diagnosis not present

## 2021-04-01 DIAGNOSIS — D2262 Melanocytic nevi of left upper limb, including shoulder: Secondary | ICD-10-CM | POA: Diagnosis not present

## 2021-04-01 DIAGNOSIS — D2261 Melanocytic nevi of right upper limb, including shoulder: Secondary | ICD-10-CM | POA: Diagnosis not present

## 2021-04-01 DIAGNOSIS — D2272 Melanocytic nevi of left lower limb, including hip: Secondary | ICD-10-CM | POA: Diagnosis not present

## 2021-04-01 DIAGNOSIS — X32XXXA Exposure to sunlight, initial encounter: Secondary | ICD-10-CM | POA: Diagnosis not present

## 2021-04-08 DIAGNOSIS — M5116 Intervertebral disc disorders with radiculopathy, lumbar region: Secondary | ICD-10-CM | POA: Diagnosis not present

## 2021-04-08 DIAGNOSIS — M419 Scoliosis, unspecified: Secondary | ICD-10-CM | POA: Diagnosis not present

## 2021-04-08 DIAGNOSIS — M4316 Spondylolisthesis, lumbar region: Secondary | ICD-10-CM | POA: Diagnosis not present

## 2021-04-08 DIAGNOSIS — I700 Atherosclerosis of aorta: Secondary | ICD-10-CM | POA: Diagnosis not present

## 2021-04-12 DIAGNOSIS — M5441 Lumbago with sciatica, right side: Secondary | ICD-10-CM | POA: Diagnosis not present

## 2021-04-12 DIAGNOSIS — M5116 Intervertebral disc disorders with radiculopathy, lumbar region: Secondary | ICD-10-CM | POA: Diagnosis not present

## 2021-04-12 DIAGNOSIS — M5442 Lumbago with sciatica, left side: Secondary | ICD-10-CM | POA: Diagnosis not present

## 2021-04-13 ENCOUNTER — Other Ambulatory Visit: Payer: Self-pay | Admitting: Internal Medicine

## 2021-04-13 DIAGNOSIS — M5116 Intervertebral disc disorders with radiculopathy, lumbar region: Secondary | ICD-10-CM

## 2021-04-13 DIAGNOSIS — M5442 Lumbago with sciatica, left side: Secondary | ICD-10-CM

## 2021-04-20 ENCOUNTER — Ambulatory Visit
Admission: RE | Admit: 2021-04-20 | Discharge: 2021-04-20 | Disposition: A | Discharge status: Home / Self Care | Payer: PPO | Source: Ambulatory Visit | Attending: Internal Medicine | Admitting: Internal Medicine

## 2021-04-20 ENCOUNTER — Other Ambulatory Visit: Payer: Self-pay

## 2021-04-20 DIAGNOSIS — M545 Low back pain, unspecified: Secondary | ICD-10-CM | POA: Diagnosis not present

## 2021-04-20 DIAGNOSIS — M5442 Lumbago with sciatica, left side: Secondary | ICD-10-CM

## 2021-04-20 DIAGNOSIS — M5116 Intervertebral disc disorders with radiculopathy, lumbar region: Secondary | ICD-10-CM

## 2021-04-21 DIAGNOSIS — M5136 Other intervertebral disc degeneration, lumbar region: Secondary | ICD-10-CM | POA: Diagnosis not present

## 2021-04-21 DIAGNOSIS — M5416 Radiculopathy, lumbar region: Secondary | ICD-10-CM | POA: Diagnosis not present

## 2021-04-21 DIAGNOSIS — M5126 Other intervertebral disc displacement, lumbar region: Secondary | ICD-10-CM | POA: Diagnosis not present

## 2021-04-22 DIAGNOSIS — M4726 Other spondylosis with radiculopathy, lumbar region: Secondary | ICD-10-CM | POA: Diagnosis not present

## 2021-04-27 DIAGNOSIS — M4726 Other spondylosis with radiculopathy, lumbar region: Secondary | ICD-10-CM | POA: Diagnosis not present

## 2021-04-30 DIAGNOSIS — M4726 Other spondylosis with radiculopathy, lumbar region: Secondary | ICD-10-CM | POA: Diagnosis not present

## 2021-05-04 DIAGNOSIS — M4726 Other spondylosis with radiculopathy, lumbar region: Secondary | ICD-10-CM | POA: Diagnosis not present

## 2021-05-07 DIAGNOSIS — M4726 Other spondylosis with radiculopathy, lumbar region: Secondary | ICD-10-CM | POA: Diagnosis not present

## 2021-05-11 DIAGNOSIS — M4726 Other spondylosis with radiculopathy, lumbar region: Secondary | ICD-10-CM | POA: Diagnosis not present

## 2021-05-18 DIAGNOSIS — M4726 Other spondylosis with radiculopathy, lumbar region: Secondary | ICD-10-CM | POA: Diagnosis not present

## 2021-05-20 DIAGNOSIS — M4726 Other spondylosis with radiculopathy, lumbar region: Secondary | ICD-10-CM | POA: Diagnosis not present

## 2021-05-24 DIAGNOSIS — N401 Enlarged prostate with lower urinary tract symptoms: Secondary | ICD-10-CM | POA: Diagnosis not present

## 2021-05-24 DIAGNOSIS — N5201 Erectile dysfunction due to arterial insufficiency: Secondary | ICD-10-CM | POA: Diagnosis not present

## 2021-05-24 DIAGNOSIS — R972 Elevated prostate specific antigen [PSA]: Secondary | ICD-10-CM | POA: Diagnosis not present

## 2021-05-24 DIAGNOSIS — E291 Testicular hypofunction: Secondary | ICD-10-CM | POA: Diagnosis not present

## 2021-05-26 DIAGNOSIS — M4726 Other spondylosis with radiculopathy, lumbar region: Secondary | ICD-10-CM | POA: Diagnosis not present

## 2021-05-27 ENCOUNTER — Other Ambulatory Visit: Payer: Self-pay | Admitting: Urology

## 2021-05-27 DIAGNOSIS — R972 Elevated prostate specific antigen [PSA]: Secondary | ICD-10-CM

## 2021-06-02 DIAGNOSIS — H61001 Unspecified perichondritis of right external ear: Secondary | ICD-10-CM | POA: Diagnosis not present

## 2021-06-02 DIAGNOSIS — M4726 Other spondylosis with radiculopathy, lumbar region: Secondary | ICD-10-CM | POA: Diagnosis not present

## 2021-06-02 DIAGNOSIS — X32XXXA Exposure to sunlight, initial encounter: Secondary | ICD-10-CM | POA: Diagnosis not present

## 2021-06-02 DIAGNOSIS — D485 Neoplasm of uncertain behavior of skin: Secondary | ICD-10-CM | POA: Diagnosis not present

## 2021-06-02 DIAGNOSIS — L570 Actinic keratosis: Secondary | ICD-10-CM | POA: Diagnosis not present

## 2021-06-02 DIAGNOSIS — S30811A Abrasion of abdominal wall, initial encounter: Secondary | ICD-10-CM | POA: Diagnosis not present

## 2021-06-07 ENCOUNTER — Other Ambulatory Visit: Payer: Self-pay

## 2021-06-07 ENCOUNTER — Ambulatory Visit
Admission: RE | Admit: 2021-06-07 | Discharge: 2021-06-07 | Disposition: A | Discharge status: Home / Self Care | Payer: PPO | Source: Ambulatory Visit | Attending: Urology | Admitting: Urology

## 2021-06-07 DIAGNOSIS — N400 Enlarged prostate without lower urinary tract symptoms: Secondary | ICD-10-CM | POA: Diagnosis not present

## 2021-06-07 DIAGNOSIS — R972 Elevated prostate specific antigen [PSA]: Secondary | ICD-10-CM

## 2021-06-07 DIAGNOSIS — N402 Nodular prostate without lower urinary tract symptoms: Secondary | ICD-10-CM | POA: Diagnosis not present

## 2021-06-07 DIAGNOSIS — R590 Localized enlarged lymph nodes: Secondary | ICD-10-CM | POA: Diagnosis not present

## 2021-06-07 MED ORDER — GADOBUTROL 1 MMOL/ML IV SOLN
7.0000 mL | Freq: Once | INTRAVENOUS | Status: AC | PRN
  Administered 2021-06-07: 7 mL via INTRAVENOUS

## 2021-06-09 DIAGNOSIS — N401 Enlarged prostate with lower urinary tract symptoms: Secondary | ICD-10-CM | POA: Diagnosis not present

## 2021-06-09 DIAGNOSIS — R972 Elevated prostate specific antigen [PSA]: Secondary | ICD-10-CM | POA: Diagnosis not present

## 2021-06-09 DIAGNOSIS — D400 Neoplasm of uncertain behavior of prostate: Secondary | ICD-10-CM | POA: Diagnosis not present

## 2021-06-16 DIAGNOSIS — M4726 Other spondylosis with radiculopathy, lumbar region: Secondary | ICD-10-CM | POA: Diagnosis not present

## 2021-07-08 DIAGNOSIS — H6982 Other specified disorders of Eustachian tube, left ear: Secondary | ICD-10-CM | POA: Diagnosis not present

## 2021-07-08 DIAGNOSIS — Z23 Encounter for immunization: Secondary | ICD-10-CM | POA: Diagnosis not present

## 2021-07-12 DIAGNOSIS — M5116 Intervertebral disc disorders with radiculopathy, lumbar region: Secondary | ICD-10-CM | POA: Diagnosis not present

## 2021-07-12 DIAGNOSIS — H6592 Unspecified nonsuppurative otitis media, left ear: Secondary | ICD-10-CM | POA: Diagnosis not present

## 2021-07-13 ENCOUNTER — Other Ambulatory Visit: Payer: Self-pay

## 2021-07-13 ENCOUNTER — Other Ambulatory Visit
Admission: RE | Admit: 2021-07-13 | Discharge: 2021-07-13 | Disposition: A | Discharge status: Home / Self Care | Payer: PPO | Source: Ambulatory Visit | Attending: Urology | Admitting: Urology

## 2021-07-13 DIAGNOSIS — I10 Essential (primary) hypertension: Secondary | ICD-10-CM | POA: Diagnosis not present

## 2021-07-13 NOTE — Patient Instructions (Signed)
Your procedure is scheduled on: Thursday July 22, 2021. Report to Day Surgery inside [facility] 2nd floor.  To find out your arrival time please call [phone] between 1PM - 3PM on Wednesday July 21, 2021.  Remember: Instructions that are not followed completely may result in serious medical risk,  up to and including death, or upon the discretion of your surgeon and anesthesiologist your  surgery may need to be rescheduled.     _X__ 1. Do not eat food after midnight the night before your procedure.                 No chewing gum or hard candies. You may drink clear liquids up to 2 hours                 before you are scheduled to arrive for your surgery- DO not drink clear                 liquids within 2 hours of the start of your surgery.                 Clear Liquids include:  water, apple juice without pulp, clear Gatorade, G2 or                  Gatorade Zero (avoid Red/Purple/Blue), Black Coffee or Tea (Do not add                 anything to coffee or tea).  __X__2.  On the morning of surgery brush your teeth with toothpaste and water, you                may rinse your mouth with mouthwash if you wish.  Do not swallow any toothpaste or mouthwash.     _X__ 3.  No Alcohol for 24 hours before or after surgery.   _X__ 4.  Do Not Smoke or use e-cigarettes For 24 Hours Prior to Your Surgery.                 Do not use any chewable tobacco products for at least 6 hours prior to                 Surgery.  _X__  5.  Do not use any recreational drugs (marijuana, cocaine, heroin, ecstasy, MDMA or other)                For at least one week prior to your surgery.  Combination of these drugs with anesthesia                May have life threatening results.  __X__ 6.  Notify your doctor if there is any change in your medical condition      (cold, fever, infections).     Do not wear jewelry, make-up, hairpins, clips or nail polish. Do not wear lotions,  powders, or perfumes. You may wear deodorant. Do not shave 48 hours prior to surgery. Men may shave face and neck. Do not bring valuables to the hospital.    [facility] is not responsible for any belongings or valuables.  Contacts, dentures or bridgework may not be worn into surgery. Leave your suitcase in the car. After surgery it may be brought to your room. For patients admitted to the hospital, discharge time is determined by your treatment team.   Patients discharged the day of surgery will not be allowed to drive home.   Make arrangements for someone to  be with you for the first 24 hours of your Same Day Discharge.   __X__ Take these medicines the morning of surgery with A SIP OF WATER:    1. dexlansoprazole (DEXILANT) 60 MG  2.   3.   4.  5.  6.  __X__ Fleet Enema (as directed)   __X__ Use antibacterial Soap  as directed  ____ Use Benzoyl Peroxide Gel as instructed  ____ Use inhalers on the day of surgery  ____ Stop metformin 2 days prior to surgery    ____ Take 1/2 of usual insulin dose the night before surgery. No insulin the morning          of surgery.   ____ Call your PCP, cardiologist, or Pulmonologist if taking Coumadin/Plavix/aspirin and ask when to stop before your surgery.   __X__ One Week prior to surgery- Stop Anti-inflammatories such as Ibuprofen, Aleve, Advil, Motrin, meloxicam (MOBIC), diclofenac, etodolac, ketorolac, Toradol, Daypro, piroxicam, Goody's or BC powders. OK TO USE TYLENOL IF NEEDED   __X__ Stop supplements until after surgery.    ____ Bring C-Pap to the hospital.    If you have any questions regarding your pre-procedure instructions,  Please call Pre-admit Testing at [phone].

## 2021-07-15 ENCOUNTER — Other Ambulatory Visit
Admission: RE | Admit: 2021-07-15 | Discharge: 2021-07-15 | Disposition: A | Discharge status: Home / Self Care | Payer: PPO | Source: Ambulatory Visit | Attending: Urology | Admitting: Urology

## 2021-07-15 ENCOUNTER — Other Ambulatory Visit: Payer: Self-pay

## 2021-07-15 DIAGNOSIS — Z01810 Encounter for preprocedural cardiovascular examination: Secondary | ICD-10-CM | POA: Insufficient documentation

## 2021-07-15 DIAGNOSIS — I10 Essential (primary) hypertension: Secondary | ICD-10-CM | POA: Insufficient documentation

## 2021-07-15 NOTE — H&P (Signed)
Darryl King, Darryl King MEDICAL RECORD NO: [id number] ACCOUNT NO: [id number] DATE OF BIRTH: April 17, 1951 FACILITY: [facility] LOCATION: [facility] PHYSICIAN: Otelia Limes. Yves Dill, MD  History and Physical   DATE OF ADMISSION: 07/13/2021  DATE OF SURGERY:  07/22/2021  CHIEF COMPLAINT:  Elevated PSA and abnormal prostate gland MRI scan.  HISTORY OF PRESENT ILLNESS:  The patient is a 70 year old white male who was found to have an elevated PSA of 7.0 ng/mL.  He underwent prostate gland MRI scan on 09/26 revealing a 49.6 mL prostate with three PI-RADS category 3 lesions.  He had two  lesions on the left and one on the right.  He comes in now for UroNav fusion, biopsy of the prostate gland.  PAST MEDICAL HISTORY:  No drug allergies.  CURRENT MEDICATIONS: Restoril, multivitamins, Dexilant, magnesium, testosterone cream and sildenafil.  PAST SURGICAL HISTORY:   1.  1985 back surgery. 2.  TUMT 2017.  PAST AND CURRENT MEDICAL CONDITIONS.  1.  GERD. 2.  Hypogonadism. 3.  Erectile dysfunction.  REVIEW OF SYSTEMS:  Patient denies chest pain, shortness of breath, diabetes, stroke or heart disease.  SOCIAL HISTORY:  The patient had tobacco use.  He consumes five alcoholic beverages per week.  FAMILY HISTORY:  Father died at unknown age of prostate cancer.  Mother died of unknown reasons.  PHYSICAL EXAMINATION:   VITAL SIGNS:  Height was 5 feet 6 inches, weight 171 pounds, BMI 28.   GENERAL:  Well nourished white male in no acute distress. HEENT:  Sclerae were clear.  Pupils are equally round, reactive to light and accommodation.  Extraocular motions are intact. NECK:  No palpable masses or tenderness.  No audible carotid bruits. PULMONARY:  Clear to auscultation. CARDIOVASCULAR:  Regular rhythm and rate without audible murmurs. ABDOMEN:  Soft, nontender. LYMPHATIC:  No palpable cervical or inguinal adenopathy. GENITOURINARY:  Circumcised.  Testes smooth, nontender 18 mL in size each.  Rectal exam  30 gram smooth, nontender prostate. NEUROMUSCULAR:  Alert and oriented x3.  IMPRESSION:   1.  Elevated PSA. 2.  Abnormal prostate gland MRI scan.    PLAN:  UroNav fusion, biopsy of prostate.   Elián.Darby D: 07/15/2021 11:06:52 am T: 07/15/2021 11:28:00 am  JOB: [id number]/ [id number]

## 2021-07-22 ENCOUNTER — Ambulatory Visit
Admission: RE | Admit: 2021-07-22 | Discharge: 2021-07-22 | Disposition: A | Discharge status: Home / Self Care | Payer: PPO | Attending: Urology | Admitting: Urology

## 2021-07-22 ENCOUNTER — Encounter: Payer: Self-pay | Admitting: Urology

## 2021-07-22 ENCOUNTER — Encounter
Admission: RE | Disposition: A | Discharge status: Home / Self Care | Payer: Self-pay | Source: Home / Self Care | Attending: Urology

## 2021-07-22 ENCOUNTER — Other Ambulatory Visit: Payer: Self-pay

## 2021-07-22 ENCOUNTER — Ambulatory Visit: Payer: PPO | Admitting: Anesthesiology

## 2021-07-22 DIAGNOSIS — Z85038 Personal history of other malignant neoplasm of large intestine: Secondary | ICD-10-CM | POA: Insufficient documentation

## 2021-07-22 DIAGNOSIS — D400 Neoplasm of uncertain behavior of prostate: Secondary | ICD-10-CM | POA: Diagnosis not present

## 2021-07-22 DIAGNOSIS — K219 Gastro-esophageal reflux disease without esophagitis: Secondary | ICD-10-CM | POA: Insufficient documentation

## 2021-07-22 DIAGNOSIS — R972 Elevated prostate specific antigen [PSA]: Secondary | ICD-10-CM | POA: Diagnosis not present

## 2021-07-22 DIAGNOSIS — C61 Malignant neoplasm of prostate: Secondary | ICD-10-CM | POA: Insufficient documentation

## 2021-07-22 HISTORY — PX: PROSTATE BIOPSY: SHX241

## 2021-07-22 SURGERY — BIOPSY, PROSTATE
Anesthesia: General

## 2021-07-22 MED ORDER — ACETAMINOPHEN 10 MG/ML IV SOLN
INTRAVENOUS | Status: AC
  Filled 2021-07-22: qty 100

## 2021-07-22 MED ORDER — FENTANYL CITRATE (PF) 100 MCG/2ML IJ SOLN
INTRAMUSCULAR | Status: AC
  Filled 2021-07-22: qty 2

## 2021-07-22 MED ORDER — LACTATED RINGERS IV SOLN: INTRAVENOUS | Status: DC

## 2021-07-22 MED ORDER — ACETAMINOPHEN 10 MG/ML IV SOLN: 1000.0000 mg | Freq: Once | INTRAVENOUS | Status: DC | PRN

## 2021-07-22 MED ORDER — GENTAMICIN SULFATE 40 MG/ML IJ SOLN
80.0000 mg | Freq: Once | INTRAVENOUS | Status: DC
  Filled 2021-07-22: qty 2

## 2021-07-22 MED ORDER — FENTANYL CITRATE (PF) 100 MCG/2ML IJ SOLN
25.0000 ug | INTRAMUSCULAR | Status: DC | PRN
  Administered 2021-07-22: 25 ug via INTRAVENOUS

## 2021-07-22 MED ORDER — LEVOFLOXACIN 500 MG PO TABS: 500.0000 mg | ORAL_TABLET | Freq: Every day | ORAL | 0 refills | Status: DC

## 2021-07-22 MED ORDER — CEFAZOLIN SODIUM-DEXTROSE 1-4 GM/50ML-% IV SOLN
INTRAVENOUS | Status: AC
  Filled 2021-07-22: qty 50

## 2021-07-22 MED ORDER — CEFAZOLIN SODIUM-DEXTROSE 1-4 GM/50ML-% IV SOLN
1.0000 g | Freq: Once | INTRAVENOUS | Status: AC
  Administered 2021-07-22: 1 g via INTRAVENOUS

## 2021-07-22 MED ORDER — ACETAMINOPHEN 10 MG/ML IV SOLN
INTRAVENOUS | Status: DC | PRN
  Administered 2021-07-22: 1000 mg via INTRAVENOUS

## 2021-07-22 MED ORDER — ORAL CARE MOUTH RINSE: 15.0000 mL | Freq: Once | OROMUCOSAL | Status: AC

## 2021-07-22 MED ORDER — ONDANSETRON HCL 4 MG/2ML IJ SOLN
INTRAMUSCULAR | Status: DC | PRN
  Administered 2021-07-22: 4 mg via INTRAVENOUS

## 2021-07-22 MED ORDER — LACTATED RINGERS IV SOLN: INTRAVENOUS | Status: DC | PRN

## 2021-07-22 MED ORDER — OXYCODONE HCL 5 MG/5ML PO SOLN: 5.0000 mg | Freq: Once | ORAL | Status: DC | PRN

## 2021-07-22 MED ORDER — PROPOFOL 10 MG/ML IV BOLUS
INTRAVENOUS | Status: DC | PRN
  Administered 2021-07-22: 140 mg via INTRAVENOUS

## 2021-07-22 MED ORDER — CHLORHEXIDINE GLUCONATE 0.12 % MT SOLN: 15.0000 mL | Freq: Once | OROMUCOSAL | Status: AC

## 2021-07-22 MED ORDER — CHLORHEXIDINE GLUCONATE 0.12 % MT SOLN
OROMUCOSAL | Status: AC
  Administered 2021-07-22: 15 mL via OROMUCOSAL
  Filled 2021-07-22: qty 15

## 2021-07-22 MED ORDER — DEXAMETHASONE SODIUM PHOSPHATE 10 MG/ML IJ SOLN
INTRAMUSCULAR | Status: DC | PRN
  Administered 2021-07-22: 10 mg via INTRAVENOUS

## 2021-07-22 MED ORDER — OXYCODONE HCL 5 MG PO TABS: 5.0000 mg | ORAL_TABLET | Freq: Once | ORAL | Status: DC | PRN

## 2021-07-22 MED ORDER — FENTANYL CITRATE (PF) 100 MCG/2ML IJ SOLN
INTRAMUSCULAR | Status: DC | PRN
  Administered 2021-07-22: 50 ug via INTRAVENOUS

## 2021-07-22 MED ORDER — ONDANSETRON HCL 4 MG/2ML IJ SOLN: 4.0000 mg | Freq: Once | INTRAMUSCULAR | Status: DC | PRN

## 2021-07-22 MED ORDER — GENTAMICIN IN SALINE 1.6-0.9 MG/ML-% IV SOLN
80.0000 mg | INTRAVENOUS | Status: AC
  Administered 2021-07-22: 80 mg via INTRAVENOUS
  Filled 2021-07-22: qty 50

## 2021-07-22 MED ORDER — 0.9 % SODIUM CHLORIDE (POUR BTL) OPTIME
TOPICAL | Status: DC | PRN
  Administered 2021-07-22: 30 mL

## 2021-07-22 MED ORDER — ROCURONIUM BROMIDE 100 MG/10ML IV SOLN
INTRAVENOUS | Status: DC | PRN
  Administered 2021-07-22: 40 mg via INTRAVENOUS
  Administered 2021-07-22: 10 mg via INTRAVENOUS

## 2021-07-22 MED ORDER — SUGAMMADEX SODIUM 200 MG/2ML IV SOLN
INTRAVENOUS | Status: DC | PRN
  Administered 2021-07-22: 100 mg via INTRAVENOUS
  Administered 2021-07-22: 200 mg via INTRAVENOUS

## 2021-07-22 MED ORDER — LIDOCAINE HCL (CARDIAC) PF 100 MG/5ML IV SOSY
PREFILLED_SYRINGE | INTRAVENOUS | Status: DC | PRN
  Administered 2021-07-22: 60 mg via INTRAVENOUS

## 2021-07-22 MED ORDER — GLYCOPYRROLATE 0.2 MG/ML IJ SOLN
INTRAMUSCULAR | Status: DC | PRN
  Administered 2021-07-22: .2 mg via INTRAVENOUS

## 2021-07-22 MED ORDER — SUCCINYLCHOLINE CHLORIDE 200 MG/10ML IV SOSY
PREFILLED_SYRINGE | INTRAVENOUS | Status: DC | PRN
  Administered 2021-07-22: 100 mg via INTRAVENOUS

## 2021-07-22 SURGICAL SUPPLY — 15 items
COVER MAYO STAND REUSABLE (DRAPES) ×2 IMPLANT
COVER TRANSDUCER ULTRASOUND (MISCELLANEOUS) ×1 IMPLANT
GLOVE SURG ENC MOIS LTX SZ7 (GLOVE) ×4 IMPLANT
GUIDE NDL URONAV ULTRASND S (MISCELLANEOUS) IMPLANT
GUIDE NEEDLE URONAV ULTRASND S (MISCELLANEOUS) ×1 IMPLANT
INST BIOPSY MAXCORE 18GX25 (NEEDLE) ×2 IMPLANT
PROBE URONAV BK 8808E 8818 HLD (MISCELLANEOUS) IMPLANT
STRAP SAFETY 5IN WIDE (MISCELLANEOUS) ×2 IMPLANT
SURGILUBE 2OZ TUBE FLIPTOP (MISCELLANEOUS) ×2 IMPLANT
TOWEL OR 17X26 4PK STRL BLUE (TOWEL DISPOSABLE) ×2 IMPLANT
URONAV BK 8808E 8818 PROBE HLD (MISCELLANEOUS) ×2
URONAV MRI FUSION TWO PATIENTS (MISCELLANEOUS) ×1 IMPLANT
URONAV ULTRASOUND (MISCELLANEOUS) ×1 IMPLANT
URONAV ULTRASOUND NDL GUIDE S (MISCELLANEOUS) ×2
WATER STERILE IRR 500ML POUR (IV SOLUTION) ×2 IMPLANT

## 2021-07-22 NOTE — Discharge Instructions (Addendum)
Transrectal Ultrasound-Guided Prostate Biopsy, Care After What can I expect after the procedure? After the procedure, it is common to have: Pain and discomfort near your butt (rectum), especially while sitting. Pink-colored pee (urine). This is due to small amounts of blood in your pee. A burning feeling while peeing. Blood in your poop (stool). Bleeding from your butt. Blood in your semen. Follow these instructions at home: Medicines Take over-the-counter and prescription medicines only as told by your doctor. If you were given a sedative during your procedure, do not drive or use machines until your doctor says that it is safe. A sedative is a medicine that helps you relax. If you were prescribed an antibiotic medicine, take it as told by your doctor. Do not stop taking it even if you start to feel better. Activity   AMBULATORY SURGERY  DISCHARGE INSTRUCTIONS   The drugs that you were given will stay in your system until tomorrow so for the next 24 hours you should not:  Drive an automobile Make any legal decisions Drink any alcoholic beverage   You may resume regular meals tomorrow.  Today it is better to start with liquids and gradually work up to solid foods.  You may eat anything you prefer, but it is better to start with liquids, then soup and crackers, and gradually work up to solid foods.   Please notify your doctor immediately if you have any unusual bleeding, trouble breathing, redness and pain at the surgery site, drainage, fever, or pain not relieved by medication.    Additional Instructions:     Please contact your physician with any problems or Same Day Surgery at [phone], Monday through Friday 6 am to 4 pm, or [place] at [facility] number at [phone].

## 2021-07-22 NOTE — Transfer of Care (Signed)
Immediate Anesthesia Transfer of Care Note  Patient: Darryl King  Procedure(s) Performed: PROSTATE BIOPSY URONAV  Patient Location: PACU  Anesthesia Type:General  Level of Consciousness: awake, drowsy and patient cooperative  Airway & Oxygen Therapy: Patient Spontanous Breathing and Patient connected to face mask oxygen  Post-op Assessment: Report given to RN and Post -op Vital signs reviewed and stable  Post vital signs: Reviewed and stable  Last Vitals:  Vitals Value Taken Time  BP    Temp    Pulse 65 07/22/21 0913  Resp 11 07/22/21 0913  SpO2 98 % 07/22/21 0913  Vitals shown include unvalidated device data.  Last Pain:  Vitals:   07/22/21 0907  TempSrc:   PainSc: 0-No pain         Complications: No notable events documented.

## 2021-07-22 NOTE — H&P (Signed)
Date of Initial H&P: 07/15/21  History reviewed, patient examined, no change in status, stable for surgery.

## 2021-07-22 NOTE — Anesthesia Procedure Notes (Signed)
Procedure Name: General with mask airway Date/Time: 07/22/2021 8:42 AM Performed by: Kelton Pillar, CRNA Pre-anesthesia Checklist: Patient identified, Emergency Drugs available, Suction available and Patient being monitored Patient Re-evaluated:Patient Re-evaluated prior to induction Oxygen Delivery Method: Circle system utilized Preoxygenation: Pre-oxygenation with 100% oxygen Induction Type: IV induction Ventilation: Mask ventilation without difficulty Laryngoscope Size: McGraph and 3 Grade View: Grade I Tube type: Oral Tube size: 6.5 mm Number of attempts: 1 Airway Equipment and Method: Stylet and Oral airway Placement Confirmation: ETT inserted through vocal cords under direct vision, positive ETCO2, breath sounds checked- equal and bilateral and CO2 detector Secured at: 21 cm Tube secured with: Tape Dental Injury: Teeth and Oropharynx as per pre-operative assessment

## 2021-07-22 NOTE — Anesthesia Preprocedure Evaluation (Addendum)
Anesthesia Evaluation  Patient identified by MRN, date of birth, ID band Patient awake    Reviewed: Allergy & Precautions, NPO status , Patient's Chart, lab work & pertinent test results  History of Anesthesia Complications Negative for: history of anesthetic complications  Airway Mallampati: III   Neck ROM: Full    Dental no notable dental hx.    Pulmonary neg pulmonary ROS,    Pulmonary exam normal breath sounds clear to auscultation       Cardiovascular Exercise Tolerance: Good negative cardio ROS Normal cardiovascular exam Rhythm:Regular Rate:Normal  ECG 07/15/21: normal   Neuro/Psych negative neurological ROS     GI/Hepatic GERD  ,Colon CA   Endo/Other  negative endocrine ROS  Renal/GU negative Renal ROS     Musculoskeletal   Abdominal   Peds  Hematology negative hematology ROS (+)   Anesthesia Other Findings Cardiology note 07/31/20:  ASSESSMENT AND PLAN:  CHEST PAIN:   His chest pain has resolved.  He had normal images on his perfusion study.   I did review these and there was some borderline ST change in some ventricular ectopy.  However, there were no high risk features.  I do not think further cardiovascular testing is suggested based on these results and he can slowly resume his physical activity and let me know if symptoms recur.   PALPITATIONS: He is no longer having these.  No change in therapy.  Reproductive/Obstetrics                            Anesthesia Physical Anesthesia Plan  ASA: 2  Anesthesia Plan: General   Post-op Pain Management:    Induction: Intravenous  PONV Risk Score and Plan: 2 and Ondansetron, Dexamethasone and Treatment may vary due to age or medical condition  Airway Management Planned: Oral ETT  Additional Equipment:   Intra-op Plan:   Post-operative Plan: Extubation in OR  Informed Consent: I have reviewed the patients History and  Physical, chart, labs and discussed the procedure including the risks, benefits and alternatives for the proposed anesthesia with the patient or authorized representative who has indicated his/her understanding and acceptance.     Dental advisory given  Plan Discussed with: CRNA  Anesthesia Plan Comments: (Patient consented for risks of anesthesia including but not limited to:  - adverse reactions to medications - damage to eyes, teeth, lips or other oral mucosa - nerve damage due to positioning  - sore throat or hoarseness - damage to heart, brain, nerves, lungs, other parts of body or loss of life  Informed patient about role of CRNA in peri- and intra-operative care.  Patient voiced understanding.)       Anesthesia Quick Evaluation

## 2021-07-22 NOTE — Anesthesia Postprocedure Evaluation (Signed)
Anesthesia Post Note  Patient: Darryl King  Procedure(s) Performed: PROSTATE BIOPSY URONAV  Patient location during evaluation: PACU Anesthesia Type: General Level of consciousness: awake and alert Pain management: pain level controlled Vital Signs Assessment: post-procedure vital signs reviewed and stable Respiratory status: spontaneous breathing, nonlabored ventilation, respiratory function stable and patient connected to nasal cannula oxygen Cardiovascular status: blood pressure returned to baseline and stable Postop Assessment: no apparent nausea or vomiting Anesthetic complications: no   No notable events documented.   Last Vitals:  Vitals:   07/22/21 0935 07/22/21 0943  BP:  140/80  Pulse: 68 68  Resp: 10 18  Temp: 36.5 C (!) 36.2 C  SpO2: 98% 98%    Last Pain:  Vitals:   07/22/21 0943  TempSrc: Temporal  PainSc: [place]

## 2021-07-22 NOTE — Op Note (Signed)
Preoperative diagnosis: 1.  Elevated PSA (R97.2)                                           2.  Abnormal prostate gland MRI scan (D40.0)  Postoperative diagnosis: Same  Procedure: Uronav fusion prostate biopsy ([facility] [place], [phone])  Surgeon: Otelia Limes. Yves Dill MD  Anesthesia: General  Indications:See the history and physical. After informed consent the above procedure(s) were requested     Technique and findings: After adequate general anesthesia been obtained the patient was placed into left lateral decubitus position and DRE performed.  The rectal vault was noted to be clear.  The ultrasound probe was placed and images acquired.  The ultrasound images were then fused with the MRI images.  Region of interest #1 on the left side was identified and 3 core biopsies taken.  Region of interest #2 on the right side was identified and 3 core biopsies taken.  Region of interest #3 on the left anterior prostate was identified and 3 core biopsies taken here.  At this point standard 12 core systematic biopsies were performed.  The ultrasound probe was removed.  Blood loss was minimal.  Procedure was then terminated and patient transferred to the recovery room in stable condition.

## 2021-07-23 DIAGNOSIS — H903 Sensorineural hearing loss, bilateral: Secondary | ICD-10-CM | POA: Diagnosis not present

## 2021-07-23 DIAGNOSIS — H9122 Sudden idiopathic hearing loss, left ear: Secondary | ICD-10-CM | POA: Diagnosis not present

## 2021-07-26 LAB — SURGICAL PATHOLOGY

## 2021-07-28 DIAGNOSIS — F5101 Primary insomnia: Secondary | ICD-10-CM | POA: Diagnosis not present

## 2021-07-28 DIAGNOSIS — H8102 Meniere's disease, left ear: Secondary | ICD-10-CM | POA: Diagnosis not present

## 2021-07-28 DIAGNOSIS — C61 Malignant neoplasm of prostate: Secondary | ICD-10-CM | POA: Insufficient documentation

## 2021-07-28 DIAGNOSIS — D400 Neoplasm of uncertain behavior of prostate: Secondary | ICD-10-CM | POA: Diagnosis not present

## 2021-07-28 DIAGNOSIS — R972 Elevated prostate specific antigen [PSA]: Secondary | ICD-10-CM | POA: Diagnosis not present

## 2021-07-28 DIAGNOSIS — N401 Enlarged prostate with lower urinary tract symptoms: Secondary | ICD-10-CM | POA: Diagnosis not present

## 2021-08-10 DIAGNOSIS — H9122 Sudden idiopathic hearing loss, left ear: Secondary | ICD-10-CM | POA: Diagnosis not present

## 2021-08-10 DIAGNOSIS — H90A22 Sensorineural hearing loss, unilateral, left ear, with restricted hearing on the contralateral side: Secondary | ICD-10-CM | POA: Diagnosis not present

## 2021-08-11 ENCOUNTER — Other Ambulatory Visit: Payer: Self-pay | Admitting: Unknown Physician Specialty

## 2021-08-11 DIAGNOSIS — H9122 Sudden idiopathic hearing loss, left ear: Secondary | ICD-10-CM

## 2021-08-18 ENCOUNTER — Other Ambulatory Visit: Payer: Self-pay

## 2021-08-18 ENCOUNTER — Ambulatory Visit
Admission: RE | Admit: 2021-08-18 | Discharge: 2021-08-18 | Disposition: A | Discharge status: Home / Self Care | Payer: PPO | Source: Ambulatory Visit | Attending: Unknown Physician Specialty | Admitting: Unknown Physician Specialty

## 2021-08-18 DIAGNOSIS — H9122 Sudden idiopathic hearing loss, left ear: Secondary | ICD-10-CM

## 2021-08-18 DIAGNOSIS — H9192 Unspecified hearing loss, left ear: Secondary | ICD-10-CM | POA: Diagnosis not present

## 2021-08-18 MED ORDER — GADOBENATE DIMEGLUMINE 529 MG/ML IV SOLN
15.0000 mL | Freq: Once | INTRAVENOUS | Status: AC | PRN
  Administered 2021-08-18: 15 mL via INTRAVENOUS

## 2021-11-12 ENCOUNTER — Other Ambulatory Visit: Payer: Self-pay

## 2021-11-12 MED ORDER — LANSOPRAZOLE 30 MG PO CPDR: 30.0000 mg | DELAYED_RELEASE_CAPSULE | Freq: Two times a day (BID) | ORAL | 11 refills | Status: AC

## 2021-11-18 ENCOUNTER — Other Ambulatory Visit: Payer: Self-pay

## 2021-11-18 ENCOUNTER — Encounter
Admission: RE | Admit: 2021-11-18 | Discharge: 2021-11-18 | Disposition: A | Discharge status: Home / Self Care | Payer: Medicare Other | Source: Ambulatory Visit | Attending: Urology | Admitting: Urology

## 2021-11-18 HISTORY — DX: Gastro-esophageal reflux disease without esophagitis: K21.9

## 2021-11-18 NOTE — Patient Instructions (Addendum)
Your procedure is scheduled on: Thursday 11/25/21 ?Report to the Registration Desk on the 1st floor of the [facility]. ?To find out your arrival time, please call [phone] between 1PM - 3PM on: Wednesday 11/24/21 ? ?REMEMBER: ?Instructions that are not followed completely may result in serious medical risk, up to and including death; or upon the discretion of your surgeon and anesthesiologist your surgery may need to be rescheduled. ? ?Do not eat or drink after midnight the night before surgery.  ?No gum chewing, lozengers or hard candies. ? ?TAKE THESE MEDICATIONS THE MORNING OF SURGERY WITH A SIP OF WATER: ?ALPRAZolam (XANAX) 1 MG tablet if needed ? ? ?Famotidine (PEPCID) 40 MG tablet and lansoprazole (PREVACID) 30 MG capsule (take one the night before and one on the morning of surgery - helps to prevent nausea after surgery.) ? ?One week prior to surgery: ?Stop taking your meloxicam (MOBIC) 15 MG tablet and any other Anti-inflammatories (NSAIDS) such as Advil, Aleve, Ibuprofen, Motrin, Naproxen, Naprosyn and Aspirin based products such as Excedrin, Goodys Powder, BC Powder. ?Stop taking your cholecalciferol (VITAMIN D3) 25 MCG (1000 UNIT) tablet, Magnesium 250 MG TABS, Saw Palmetto, Serenoa repens, (SAW PALMETTO PO), SUPER B COMPLEX/C PO, and ANY other OVER THE COUNTER supplements until after surgery. ?You may however, continue to take Tylenol if needed for pain up until the day of surgery. ? ?No Alcohol for 24 hours before or after surgery. ? ?No Smoking including e-cigarettes for 24 hours prior to surgery.  ?No chewable tobacco products for at least 6 hours prior to surgery.  ?No nicotine patches on the day of surgery. ? ?Do not use any "recreational" drugs for at least a week prior to your surgery.  ?Please be advised that the combination of cocaine and anesthesia may have negative outcomes, up to and including death. ?If you test positive for cocaine, your surgery will be cancelled. ? ?On the morning of  surgery brush your teeth with toothpaste and water, you may rinse your mouth with mouthwash if you wish. ?Do not swallow any toothpaste or mouthwash. ? ?Do not wear jewelry. ? ?Do not wear lotions, powders, or colognes.  ? ?Do not shave body from the neck down 48 hours prior to surgery just in case you cut yourself which could leave a site for infection.  ?Also, freshly shaved skin may become irritated if using the CHG soap. ? ?Do not bring valuables to the hospital. [facility] is not responsible for any missing/lost belongings or valuables.  ? ?Fleets enema the night prior to surgery and the morning of the surgery as directed and repeat until clear.  ? ?Notify your doctor if there is any change in your medical condition (cold, fever, infection). ? ?Wear comfortable clothing (specific to your surgery type) to the hospital. ? ?If you are being discharged the day of surgery, you will not be allowed to drive home. ?You will need a responsible adult (18 years or older) to drive you home and stay with you that night.  ? ?If you are taking public transportation, you will need to have a responsible adult (18 years or older) with you. ?Please confirm with your physician that it is acceptable to use public transportation.  ? ?Please call the [place] Dept. at [phone] if you have any questions about these instructions. ? ?Surgery Visitation Policy: ? ?Patients undergoing a surgery or procedure may have one family member or support person with them as long as that person is not COVID-19  positive or experiencing its symptoms.  ?That person may remain in the waiting area during the procedure and may rotate out with other people. ? ?Inpatient Visitation:   ? ?Visiting hours are 7 a.m. to 8 p.m. ?Up to two visitors ages 16+ are allowed at one time in a patient room. The visitors may rotate out with other people during the day. Visitors must check out when they leave, or other visitors will not be allowed. One  designated support person may remain overnight. ?The visitor must pass COVID-19 screenings, use hand sanitizer when entering and exiting the patient?s room and wear a mask at all times, including in the patient?s room. ?Patients must also wear a mask when staff or their visitor are in the room. ?Masking is required regardless of vaccination status.  ?

## 2021-11-18 NOTE — OR Nursing (Signed)
Patient stated that his wife picked up some fleets enemas for him since she thought he may need them. He declined picking up the enemas from the PAT. ?

## 2021-11-19 NOTE — H&P (Signed)
NAME: Darryl King, Darryl King ?MEDICAL RECORD NO: [id number] ?ACCOUNT NO: [id number] ?DATE OF BIRTH: Nov 02, 1950 ?FACILITY: [facility] ?LOCATION: [facility] ?PHYSICIAN: Otelia Limes. Yves Dill, MD ? ?History and Physical  ? ?DATE OF ADMISSION: 11/25/2021 ? ?CHIEF COMPLAINT:  Prostate cancer. ? ?HISTORY OF PRESENT ILLNESS:  The patient is a 71 year old white male with stage T1c, Gleason grade 3+4 adenocarcinoma of the prostate, and BPH.  He has been on Orgovyx to downsize his prostate in preparation for HIFU treatment.  Prior to the downsizing  ?his prostate volume was 49.6 mL by MRI scan Orgovyx was initiated in 07/2021.  A prostate ultrasound on 2/15 indicated a 24 mL prostate and most recent PSA was down to 0.6 ng/mL on 2/16.  Prior to the Orgovyx PSA was 7.0 ng/mL.  The patient comes in now  ?for prostate gland HIFU treatment. ? ?PAST MEDICAL HISTORY:  NO DRUG ALLERGIES. ? ?CURRENT MEDICATIONS:  Include multivitamins, Orgovyx, Restoril, Dexilant, magnesium, Xanax and sildenafil. ? ?SURGICAL HISTORY: ?1.  Lumbar back surgery in 1985. ?2.  TUMT of the prostate, 2017. ?3.  UroNav fusion biopsy of the prostate 07/2021. ? ?PAST AND CURRENT MEDICAL CONDITIONS: ?1.  GERD. ?2.  Hypogonadism. ?3.  Erectile dysfunction. ? ?REVIEW OF SYSTEMS:  The patient denies chest pain, shortness of breath, diabetes, stroke or heart disease. ? ?SOCIAL HISTORY:  The patient denied tobacco use.  He consumes five alcoholic beverages per week. ? ?FAMILY HISTORY:  Father died at unknown age of prostate cancer.  He also has an older brother who had prostate cancer.  Mother died of unknown reasons. ? ?PHYSICAL EXAMINATION:   ?VITAL SIGNS:  Height was 5 feet 6 inches, weight 171 pounds, BMI 28. ?GENERAL:  He is a well-nourished white male in no acute distress. ?HEENT:  Sclerae were clear.  Pupils are equal, round, reactive to light and accommodation.  Extraocular movements are intact. ?NECK:  No palpable masses or tenderness.  No audible carotid bruits. ?PULMONARY:   Lungs were clear to auscultation. ?CARDIOVASCULAR:  Regular rhythm and rate. ?ABDOMEN:  Soft, nontender abdomen.  No CVA tenderness. ?LYMPHATIC:  No palpable cervical or inguinal adenopathy. ?GENITOURINARY:  Circumcised.  Testes were smooth, nontender, approximately 18 mL in size each.  Rectal exam 30 gram smooth, nontender prostate. ?NEUROMUSCULAR:  Alert and oriented x3. ? ?IMPRESSION:  Stage T1c, Gleason grade 3+4 adenocarcinoma of the prostate. ? ?PLAN:  HIFU. ? ? ?SHY ?D: 11/19/2021 11:47:27 am T: 11/19/2021 12:00:00 pm  ?JOB: [id number]/ [id number]  ?

## 2021-11-24 MED ORDER — CHLORHEXIDINE GLUCONATE 0.12 % MT SOLN: 15.0000 mL | Freq: Once | OROMUCOSAL | Status: AC

## 2021-11-24 MED ORDER — ORAL CARE MOUTH RINSE: 15.0000 mL | Freq: Once | OROMUCOSAL | Status: AC

## 2021-11-24 MED ORDER — CEFAZOLIN SODIUM-DEXTROSE 1-4 GM/50ML-% IV SOLN
1.0000 g | Freq: Once | INTRAVENOUS | Status: AC
  Administered 2021-11-25: 1 g via INTRAVENOUS

## 2021-11-24 MED ORDER — LACTATED RINGERS IV SOLN: INTRAVENOUS | Status: DC

## 2021-11-25 ENCOUNTER — Encounter: Payer: Self-pay | Admitting: Urology

## 2021-11-25 ENCOUNTER — Encounter
Admission: RE | Disposition: A | Discharge status: Home / Self Care | Payer: Self-pay | Source: Home / Self Care | Attending: Urology

## 2021-11-25 ENCOUNTER — Ambulatory Visit: Payer: Medicare Other | Admitting: Anesthesiology

## 2021-11-25 ENCOUNTER — Ambulatory Visit
Admission: RE | Admit: 2021-11-25 | Discharge: 2021-11-25 | Disposition: A | Discharge status: Home / Self Care | Payer: Medicare Other | Attending: Urology | Admitting: Urology

## 2021-11-25 ENCOUNTER — Other Ambulatory Visit: Payer: Self-pay

## 2021-11-25 DIAGNOSIS — N400 Enlarged prostate without lower urinary tract symptoms: Secondary | ICD-10-CM | POA: Diagnosis not present

## 2021-11-25 DIAGNOSIS — K219 Gastro-esophageal reflux disease without esophagitis: Secondary | ICD-10-CM | POA: Diagnosis not present

## 2021-11-25 DIAGNOSIS — C61 Malignant neoplasm of prostate: Secondary | ICD-10-CM | POA: Diagnosis present

## 2021-11-25 HISTORY — PX: HIGH INTENSITY FOCUSED ULTRASOUND (HIFU) OF THE PROSTATE: SHX6793

## 2021-11-25 SURGERY — ABLATION, PROSTATE, RECTAL APPROACH, USING HIGH-INTENSITY FOCUSED ULTRASOUND
Anesthesia: General

## 2021-11-25 MED ORDER — PHENYLEPHRINE HCL-NACL 20-0.9 MG/250ML-% IV SOLN
INTRAVENOUS | Status: AC
  Filled 2021-11-25: qty 250

## 2021-11-25 MED ORDER — HYOSCYAMINE SULFATE SL 0.125 MG SL SUBL: 0.1250 mg | SUBLINGUAL_TABLET | SUBLINGUAL | 3 refills | Status: DC | PRN

## 2021-11-25 MED ORDER — MIDAZOLAM HCL 2 MG/2ML IJ SOLN
INTRAMUSCULAR | Status: DC | PRN
  Administered 2021-11-25: 2 mg via INTRAVENOUS

## 2021-11-25 MED ORDER — PHENYLEPHRINE HCL-NACL 20-0.9 MG/250ML-% IV SOLN
INTRAVENOUS | Status: DC | PRN
  Administered 2021-11-25: 15 ug/min via INTRAVENOUS

## 2021-11-25 MED ORDER — GLYCOPYRROLATE 0.2 MG/ML IJ SOLN
INTRAMUSCULAR | Status: DC | PRN
  Administered 2021-11-25: .2 mg via INTRAVENOUS

## 2021-11-25 MED ORDER — KETOROLAC TROMETHAMINE 30 MG/ML IJ SOLN
INTRAMUSCULAR | Status: DC | PRN
  Administered 2021-11-25: 30 mg via INTRAVENOUS

## 2021-11-25 MED ORDER — CEFAZOLIN SODIUM-DEXTROSE 1-4 GM/50ML-% IV SOLN
INTRAVENOUS | Status: AC
  Filled 2021-11-25: qty 50

## 2021-11-25 MED ORDER — CHLORHEXIDINE GLUCONATE 0.12 % MT SOLN
OROMUCOSAL | Status: AC
  Administered 2021-11-25: 15 mL via OROMUCOSAL
  Filled 2021-11-25: qty 15

## 2021-11-25 MED ORDER — ONDANSETRON HCL 4 MG/2ML IJ SOLN
INTRAMUSCULAR | Status: DC | PRN
Start: 2021-11-25 — End: 2021-11-25
  Administered 2021-11-25 (×2): 4 mg via INTRAVENOUS

## 2021-11-25 MED ORDER — PROPOFOL 500 MG/50ML IV EMUL
INTRAVENOUS | Status: AC
  Filled 2021-11-25: qty 50

## 2021-11-25 MED ORDER — DOCUSATE SODIUM 100 MG PO CAPS: 200.0000 mg | ORAL_CAPSULE | Freq: Two times a day (BID) | ORAL | 3 refills | Status: DC

## 2021-11-25 MED ORDER — PROPOFOL 10 MG/ML IV BOLUS
INTRAVENOUS | Status: AC
  Filled 2021-11-25: qty 20

## 2021-11-25 MED ORDER — ACETAMINOPHEN 10 MG/ML IV SOLN
INTRAVENOUS | Status: DC | PRN
Start: 2021-11-25 — End: 2021-11-25
  Administered 2021-11-25: 1000 mg via INTRAVENOUS

## 2021-11-25 MED ORDER — DEXAMETHASONE SODIUM PHOSPHATE 10 MG/ML IJ SOLN
INTRAMUSCULAR | Status: DC | PRN
Start: 2021-11-25 — End: 2021-11-25
  Administered 2021-11-25: 10 mg via INTRAVENOUS

## 2021-11-25 MED ORDER — PROPOFOL 10 MG/ML IV BOLUS
INTRAVENOUS | Status: DC | PRN
  Administered 2021-11-25: 150 mg via INTRAVENOUS

## 2021-11-25 MED ORDER — CIPROFLOXACIN HCL 500 MG PO TABS
500.0000 mg | ORAL_TABLET | Freq: Two times a day (BID) | ORAL | 0 refills | Status: DC
Start: 2021-11-25 — End: 2022-10-21

## 2021-11-25 MED ORDER — LIDOCAINE HCL (CARDIAC) PF 100 MG/5ML IV SOSY
PREFILLED_SYRINGE | INTRAVENOUS | Status: DC | PRN
  Administered 2021-11-25: 40 mg via INTRAVENOUS

## 2021-11-25 MED ORDER — EPHEDRINE SULFATE (PRESSORS) 50 MG/ML IJ SOLN
INTRAMUSCULAR | Status: DC | PRN
Start: 2021-11-25 — End: 2021-11-25
  Administered 2021-11-25: 5 mg via INTRAVENOUS

## 2021-11-25 MED ORDER — FENTANYL CITRATE (PF) 100 MCG/2ML IJ SOLN
INTRAMUSCULAR | Status: DC | PRN
  Administered 2021-11-25 (×2): 50 ug via INTRAVENOUS

## 2021-11-25 MED ORDER — FENTANYL CITRATE (PF) 100 MCG/2ML IJ SOLN
INTRAMUSCULAR | Status: AC
Start: 2021-11-25 — End: ?
  Filled 2021-11-25: qty 2

## 2021-11-25 MED ORDER — MIDAZOLAM HCL 2 MG/2ML IJ SOLN
INTRAMUSCULAR | Status: AC
  Filled 2021-11-25: qty 2

## 2021-11-25 MED ORDER — FENTANYL CITRATE (PF) 100 MCG/2ML IJ SOLN
INTRAMUSCULAR | Status: AC
  Filled 2021-11-25: qty 2

## 2021-11-25 MED ORDER — SUGAMMADEX SODIUM 200 MG/2ML IV SOLN
INTRAVENOUS | Status: DC | PRN
  Administered 2021-11-25: 300 mg via INTRAVENOUS

## 2021-11-25 MED ORDER — FENTANYL CITRATE (PF) 100 MCG/2ML IJ SOLN
25.0000 ug | INTRAMUSCULAR | Status: DC | PRN
  Administered 2021-11-25 (×4): 25 ug via INTRAVENOUS

## 2021-11-25 MED ORDER — ROCURONIUM BROMIDE 100 MG/10ML IV SOLN
INTRAVENOUS | Status: DC | PRN
  Administered 2021-11-25: 30 mg via INTRAVENOUS
  Administered 2021-11-25: 50 mg via INTRAVENOUS
  Administered 2021-11-25: 20 mg via INTRAVENOUS

## 2021-11-25 MED ORDER — SEVOFLURANE IN SOLN
RESPIRATORY_TRACT | Status: AC
  Filled 2021-11-25: qty 250

## 2021-11-25 MED ORDER — ONDANSETRON HCL 4 MG/2ML IJ SOLN: 4.0000 mg | Freq: Once | INTRAMUSCULAR | Status: DC | PRN

## 2021-11-25 SURGICAL SUPPLY — 8 items
FEE RENTAL SONABLATE (MISCELLANEOUS) IMPLANT
FEE SONABLATE RENTAL (MISCELLANEOUS) ×2 IMPLANT
FEE SONABLATE TECHNICIAN (MISCELLANEOUS) ×2 IMPLANT
FEE TECHNICIAN SONABLATE (MISCELLANEOUS) IMPLANT
HOLDER FOLEY CATH W/STRAP (MISCELLANEOUS) ×2 IMPLANT
KIT TURNOVER KIT A (KITS) ×2 IMPLANT
PACK PROSTATE SONABLATE INSERT (MISCELLANEOUS) ×1 IMPLANT
TRAY FOLEY MTR SLVR 16FR STAT (SET/KITS/TRAYS/PACK) ×2 IMPLANT

## 2021-11-25 NOTE — Op Note (Signed)
Preoperative diagnosis: Localized prostate cancer (C61) ? ?Postoperative diagnosis: Same ? ?Procedure: High intensity focused ultrasound (HIFU) (CPT [id number]) ? ?Surgeon: Otelia Limes. Yves Dill MD ? ?Anesthesia: General ? ?Indications:See the history and physical also. 71 year old (DOB 03/08/1951) white male with stage T1c, Gleason grade 3+4 adenocarcinoma of the prostate, and BPH.  He has been on Orgovyx to downsize his prostate in preparation for HIFU treatment.  Prior to the downsizing his prostate volume was 49.6 mL by MRI scan Orgovyx was initiated in 07/2021.  A prostate ultrasound on 2/15 indicated a 24 mL prostate and most recent PSA was down to 0.6 ng/mL on 2/16.  Prior to the Orgovyx PSA was 7.0 ng/mL.  The patient comes in now for prostate gland HIFU treatment. ? After informed consent the above procedure(s) were requested. ? ?   ?Technique and findings:  ? ?The patient, identified by his name bracelet, was taken to the procedure room.  After a standard timeout was performed, the staff that were present agreed on the procedure to be performed.  The patient was anesthetized, prepped and draped in the usual manner. A urethral catheter was placed. and the catheter connected to closed system gravity drainage.  The patient was placed in the lithotomy position with padded stirrups and anti-embolism compression devices applied.  The trans-rectal Sonablate probe was carefully inserted and the prostate was measured with the dimensions noted below:  ?The dimensions of the prostate were:    ?H ___2.75___ cm  W ___3.60___ cm  L __3.53___ cm,  for a calculated volume of _____18.21______cc.     ?Images in both transverse and sagittal views were obtained.  The urethral catheter was not removed.  A treatment plan was programmed. The power levels were selected based on approximate rectal wall distance as outlined in the Physician Instruction Manual.  The temperature of the rectal probe and the reflectivity index were maintained within  normal range at all times.  The targeted area of the prostate gland was treated in its entirety. The procedure was recorded on the Sonablate? Hard Drive.    A urethral catheter was connected to closed system gravity drainage. The patient was transferred to the recovery room in stable condition.    ? ?  ? ?  ?

## 2021-11-25 NOTE — Anesthesia Postprocedure Evaluation (Signed)
Anesthesia Post Note ? ?Patient: JAHMARION POPOFF ? ?Procedure(s) Performed: HIGH INTENSITY FOCUSED ULTRASOUND (HIFU) OF THE PROSTATE ? ?Patient location during evaluation: PACU ?Anesthesia Type: General ?Level of consciousness: awake and awake and alert ?Pain management: satisfactory to patient ?Vital Signs Assessment: post-procedure vital signs reviewed and stable ?Respiratory status: spontaneous breathing and respiratory function stable ?Cardiovascular status: stable ?Anesthetic complications: no ? ? ?No notable events documented. ? ? ?Last Vitals:  ?Vitals:  ? 11/25/21 0633 11/25/21 0945  ?BP: 129/77 130/71  ?Pulse: (!) 59 70  ?Resp: 16 12  ?Temp: (!) 36.2 ?C 36.4 ?C  ?SpO2: 97% 99%  ?  ?Last Pain:  ?Vitals:  ? 11/25/21 0633  ?TempSrc: Tympanic  ?PainSc: 0-No pain  ? ? ?  ?  ?  ?  ?  ?  ? ?VAN STAVEREN,Keath Matera ? ? ? ? ?

## 2021-11-25 NOTE — Anesthesia Procedure Notes (Signed)
Procedure Name: Intubation ?Date/Time: 11/25/2021 7:35 AM ?Performed by: Kelton Pillar, CRNA ?Pre-anesthesia Checklist: Patient identified, Emergency Drugs available, Suction available and Patient being monitored ?Patient Re-evaluated:Patient Re-evaluated prior to induction ?Oxygen Delivery Method: Circle system utilized ?Preoxygenation: Pre-oxygenation with 100% oxygen ?Induction Type: IV induction ?Ventilation: Mask ventilation without difficulty ?Laryngoscope Size: McGraph and 3 ?Grade View: Grade I ?Tube type: Oral ?Tube size: 7.0 mm ?Number of attempts: 1 ?Airway Equipment and Method: Stylet and Oral airway ?Placement Confirmation: ETT inserted through vocal cords under direct vision, positive ETCO2, breath sounds checked- equal and bilateral and CO2 detector ?Secured at: 21 cm ?Tube secured with: Tape ?Dental Injury: Teeth and Oropharynx as per pre-operative assessment  ? ? ? ? ?

## 2021-11-25 NOTE — Transfer of Care (Signed)
Immediate Anesthesia Transfer of Care Note ? ?Patient: Darryl King ? ?Procedure(s) Performed: HIGH INTENSITY FOCUSED ULTRASOUND (HIFU) OF THE PROSTATE ? ?Patient Location: PACU ? ?Anesthesia Type:General ? ?Level of Consciousness: awake, drowsy and patient cooperative ? ?Airway & Oxygen Therapy: Patient Spontanous Breathing and Patient connected to face mask oxygen ? ?Post-op Assessment: Report given to RN and Post -op Vital signs reviewed and stable ? ?Post vital signs: Reviewed and stable ? ?Last Vitals:  ?Vitals Value Taken Time  ?BP 130/71 11/25/21 0945  ?Temp    ?Pulse 66 11/25/21 0947  ?Resp 16 11/25/21 0947  ?SpO2 98 % 11/25/21 0947  ?Vitals shown include unvalidated device data. ? ?Last Pain:  ?Vitals:  ? 11/25/21 0633  ?TempSrc: Tympanic  ?PainSc: 0-No pain  ?   ? ?  ? ?Complications: No notable events documented. ?

## 2021-11-25 NOTE — Anesthesia Preprocedure Evaluation (Signed)
Anesthesia Evaluation  ?Patient identified by MRN, date of birth, ID band ?Patient awake ? ? ? ?Reviewed: ?Allergy & Precautions, NPO status , Patient's Chart, lab work & pertinent test results ? ?Airway ?Mallampati: II ? ?TM Distance: >3 FB ?Neck ROM: Full ? ? ? Dental ? ?(+) Teeth Intact ?  ?Pulmonary ?neg pulmonary ROS,  ?  ?Pulmonary exam normal ?breath sounds clear to auscultation ? ? ? ? ? ? Cardiovascular ?Exercise Tolerance: Good ?negative cardio ROS ?Normal cardiovascular exam ?Rhythm:Regular Rate:Normal ? ? ?  ?Neuro/Psych ?negative neurological ROS ? negative psych ROS  ? GI/Hepatic ?negative GI ROS, Neg liver ROS, GERD  ,  ?Endo/Other  ?negative endocrine ROS ? Renal/GU ?negative Renal ROS  ?negative genitourinary ?  ?Musculoskeletal ?negative musculoskeletal ROS ?(+)  ? Abdominal ?Normal abdominal exam  (+)   ?Peds ?negative pediatric ROS ?(+)  Hematology ?negative hematology ROS ?(+)   ?Anesthesia Other Findings ?Past Medical History: ?No date: B12 deficiency ?No date: Cancer [facility]) ?    Comment:  prostate ?No date: Colon polyp ?No date: Diverticulosis ?No date: GERD (gastroesophageal reflux disease) ?No date: Hyperlipidemia ?No date: Laryngopharyngeal reflux ?No date: Low testosterone ?09/2017: Tubular adenoma of colon ? ?Past Surgical History: ?1985: BACK SURGERY ?    Comment:  neck ?07/22/2021: PROSTATE BIOPSY; N/A ?    Comment:  Procedure: PROSTATE BIOPSY URONAV;  Surgeon: Yves Dill,  ?             Otelia Limes, MD;  Location: [facility] OR[facility];  Service: Urology;   ?             Laterality: N/A; ? ?BMI   ? Body Mass Index: 24.63 kg/m?  ?  ? ? Reproductive/Obstetrics ?negative OB ROS ? ?  ? ? ? ? ? ? ? ? ? ? ? ? ? ?  ?  ? ? ? ? ? ? ? ? ?Anesthesia Physical ?Anesthesia Plan ? ?ASA: 2 ? ?Anesthesia Plan: General  ? ?Post-op Pain Management:   ? ?Induction: Intravenous ? ?PONV Risk Score and Plan: Ondansetron, Dexamethasone, Midazolam and Treatment may vary due to age or medical  condition ? ?Airway Management Planned: Oral ETT ? ?Additional Equipment:  ? ?Intra-op Plan:  ? ?Post-operative Plan: Extubation in OR ? ?Informed Consent: I have reviewed the patients History and Physical, chart, labs and discussed the procedure including the risks, benefits and alternatives for the proposed anesthesia with the patient or authorized representative who has indicated his/her understanding and acceptance.  ? ? ? ?Dental Advisory Given ? ?Plan Discussed with: CRNA and Surgeon ? ?Anesthesia Plan Comments:   ? ? ? ? ? ? ?Anesthesia Quick Evaluation ? ?

## 2021-11-25 NOTE — H&P (Signed)
Date of Initial H&P: 11/19/21 ? ?History reviewed, patient examined, no change in status, stable for surgery. ?

## 2021-11-25 NOTE — Discharge Instructions (Addendum)
Indwelling Urinary Catheter Care, Adult ?An indwelling urinary catheter is a thin tube that is put into your bladder. The tube helps to drain pee (urine) out of your body. The tube goes in through your urethra. Your urethra is where pee comes out of your body. Your pee will come out through the catheter, then it will go into a bag (drainage bag). ?Take good care of your catheter so it will work well. ?What are the risks? ?Germs may get into your bladder and cause an infection. ?The tube can become blocked. ?Tissue near the catheter may become irritated and may bleed. ?How to wear your catheter and drainage bag ?Supplies needed ?Sticky tape (adhesive tape) or a leg strap. ?Alcohol wipe or soap and water (if you use tape). ?A clean towel (if you use tape). ?Large overnight bag. ?Smaller bag (leg bag). ?Wearing your catheter ?Attach your catheter to your leg with tape or a leg strap. ?Make sure the catheter is not pulled tight. ?If a leg strap gets wet, take it off and put on a dry strap. ?If you use tape to hold the bag on your leg: ?Use an alcohol wipe or soap and water to wash your skin where the tape made it sticky before. ?Use a clean towel to pat-dry that skin. ?Use new tape to make the bag stay on your leg. ?Wearing your bags ?You should have been given a large overnight bag. ?You may wear the overnight bag in the day or night. ?Always have the overnight bag lower than your bladder.  Do not let the bag touch the floor. ?Before you go to sleep, put a clean plastic bag in a wastebasket. Then, hang the overnight bag inside the wastebasket. ?You should also have a smaller leg bag that fits under your clothes. ?Wear the leg bag as told by the product maker. This may be above or below the knee, depending on the length of the tubing. ?Make sure that the leg bag is below the bladder. ?Make sure that the tubing does not have loops or too much tension. ?Do not wear your leg bag at night. ?How to care for your skin and  catheter ?Supplies needed ?A clean washcloth. ?Water and mild soap. ?A clean towel. ?Caring for your skin and catheter ?Male anatomy showing the labia, urethra, and an indwelling urinary catheter in the bladder. ? ?  ?Male anatomy showing the penis, urethra, and an indwelling urinary catheter in the bladder. ? ?Clean the skin around your catheter every day. ?Wash your hands with soap and water. ?Wet a clean washcloth in warm water and mild soap. ?Clean the skin around your urethra. ?If you are male: ?Gently spread the folds of skin around your vagina (labia). ?With the washcloth in your other hand, wipe the inner side of your labia on each side. Wipe from front to back. ?If you are male: ?Pull back any skin that covers the end of your penis (foreskin). ?With the washcloth in your other hand, wipe your penis in small circles. Start wiping at the tip of your penis, then move away from the catheter. ?Move the foreskin back in place, if needed. ?With your free hand, hold the catheter close to where it goes into your body. ?Keep holding the catheter during cleaning so it does not get pulled out. ?With the washcloth in your other hand, clean the catheter. ?Only wipe downward on the catheter, toward the drainage bag. ?Do not wipe upward toward your body. Doing this may push  germs into your urethra and cause infection. ?Use a clean towel to pat-dry the catheter and the skin around it. Make sure to wipe off all soap. ?Wash your hands with soap and water. ?Shower every day. Do not take baths. ?Do not use cream, ointment, or lotion on the area where the catheter goes into your body, unless your doctor tells you to. ?Do not use powders, sprays, or lotions on your genital area. ?Check your skin around the catheter every day for signs of infection. Check for: ?Redness, swelling, or pain. ?Fluid or blood. ?Warmth. ?Pus or a bad smell. ?How to empty the bag ?Supplies needed ?Rubbing alcohol. ?Gauze pad or cotton ball. ?Tape or  a leg strap. ?Emptying the bag ?Pour the pee out of your bag when it is ?-? full, or at least 2-3 times a day. Do this for your overnight bag and your leg bag. ?Wash your hands with soap and water. ?Separate (detach) the bag from your leg. ?Hold the bag over the toilet or a clean pail. Keep the bag lower than your hips and bladder. This is so the pee (urine) does not go back into the tube. ?Open the pour spout. It is at the bottom of the bag. ?Empty the pee into the toilet or pail. Do not let the pour spout touch any surface. ?Put rubbing alcohol on a gauze pad or cotton ball. ?Use the gauze pad or cotton ball to clean the pour spout. ?Close the pour spout. ?Attach the bag to your leg with tape or a leg strap. ?Wash your hands with soap and water. ?Follow instructions for cleaning the drainage bag. Instructions can come from: ?The product maker. ?Your doctor. ?How to change the bag ?Changing the bag ?Replace your bag when it starts to leak, smell bad, or look dirty. ?Wash your hands with soap and water. ?Separate the dirty bag from your leg. ?Pinch the catheter with your fingers so that pee does not spill out. ?Separate the catheter tube from the bag tube where these tubes connect (at the connection valve). Do not let the tubes touch any surface. ?Clean the end of the catheter tube with an alcohol wipe. Use a different alcohol wipe to clean the end of the bag tube. ?Connect the catheter tube to the tube of the clean bag. ?Attach the clean bag to your leg with tape or a leg strap. Do not make the bag tight on your leg. ?Wash your hands with soap and water. ?General instructions ?A person washing hands with soap and water. ? ?Never pull on your catheter. Never try to take it out. Doing that can hurt you. ?Always wash your hands before and after you touch your catheter or bag. Use a mild, fragrance-free soap. If you do not have soap and water, use hand sanitizer. ?Always make sure there are no twists, bends, or kinks  in the catheter tube. ?Always make sure there are no leaks in the catheter or bag. ?Drink enough fluid to keep your pee pale yellow. ?Do not take baths, swim, or use a hot tub. ?If you are male, wipe from front to back after you poop (have a bowel movement). ?Contact a doctor if: ?Your catheter gets clogged. ?Your catheter leaks. ?You have signs of infection at the catheter site, such as: ?Redness, swelling, or pain where the catheter goes into your body. ?Fluid, blood, pus, or a bad smell coming from the area where the catheter goes into your body. ?Skin feels warm where the  catheter goes into your body. ?You have signs of a bladder infection, such as: ?Fever. ?Chills. ?Pee smells worse than usual. ?Cloudy pee. ?Pain in your belly, legs, lower back, or bladder. ?Vomiting or feel like vomiting. ?Get help right away if: ?You see blood in the catheter. ?Your pee is pink or red. ?Your bladder feels full. ?Your pee is not draining into the bag. ?Your catheter gets pulled out. ?Summary ?An indwelling urinary catheter is a thin tube that is placed into the bladder to help drain pee (urine) out of the body. ?The catheter is placed into the part of the body that drains pee from the bladder (urethra). ?Taking good care of your catheter will keep it working well. ?Always wash your hands before and after touching your catheter or bag. ?Never pull on your catheter or try to take it out. ?This information is not intended to replace advice given to you by your health care provider. Make sure you discuss any questions you have with your health care provider. ?Document Revised: 04/29/2021 Document Reviewed: 04/29/2021 ?Elsevier Patient Education ? 2022 [place]. ?   ? ? ?AMBULATORY SURGERY  ?DISCHARGE INSTRUCTIONS ? ? ?The drugs that you were given will stay in your system until tomorrow so for the next 24 hours you should not: ? ?Drive an automobile ?Make any legal decisions ?Drink any alcoholic beverage ? ? ?You may resume  regular meals tomorrow.  Today it is better to start with liquids and gradually work up to solid foods. ? ?You may eat anything you prefer, but it is better to start with liquids, then soup and crackers,

## 2021-11-26 ENCOUNTER — Encounter: Payer: Self-pay | Admitting: Urology

## 2021-11-30 ENCOUNTER — Encounter: Payer: Self-pay | Admitting: Urology

## 2022-10-17 DIAGNOSIS — Z85828 Personal history of other malignant neoplasm of skin: Secondary | ICD-10-CM | POA: Diagnosis not present

## 2022-10-17 DIAGNOSIS — D2262 Melanocytic nevi of left upper limb, including shoulder: Secondary | ICD-10-CM | POA: Diagnosis not present

## 2022-10-17 DIAGNOSIS — X32XXXA Exposure to sunlight, initial encounter: Secondary | ICD-10-CM | POA: Diagnosis not present

## 2022-10-17 DIAGNOSIS — D2261 Melanocytic nevi of right upper limb, including shoulder: Secondary | ICD-10-CM | POA: Diagnosis not present

## 2022-10-17 DIAGNOSIS — D2272 Melanocytic nevi of left lower limb, including hip: Secondary | ICD-10-CM | POA: Diagnosis not present

## 2022-10-17 DIAGNOSIS — L570 Actinic keratosis: Secondary | ICD-10-CM | POA: Diagnosis not present

## 2022-10-21 ENCOUNTER — Encounter: Payer: Self-pay | Admitting: Nurse Practitioner

## 2022-10-21 ENCOUNTER — Ambulatory Visit: Payer: Medicare Other | Admitting: Nurse Practitioner

## 2022-10-21 VITALS — BP 134/70 | HR 64 | Ht 67.0 in | Wt 171.1 lb

## 2022-10-21 DIAGNOSIS — K219 Gastro-esophageal reflux disease without esophagitis: Secondary | ICD-10-CM | POA: Diagnosis not present

## 2022-10-21 DIAGNOSIS — Z86010 Personal history of colonic polyps: Secondary | ICD-10-CM

## 2022-10-21 MED ORDER — NA SULFATE-K SULFATE-MG SULF 17.5-3.13-1.6 GM/177ML PO SOLN: 1.0000 | Freq: Once | ORAL | 0 refills | Status: AC

## 2022-10-21 NOTE — Progress Notes (Unsigned)
     10/21/2022 Darryl King [id number] [birth year]   Chief Complaint:  History of Present Illness:   He presents today  If he skips one dose took  Pepcid complete  He takes Pepcid precaution Previd 4pm Pepcid at bedtime Cocktail  Sx are better.  Esophageal cancer  He complains of having acid reflux which was well contolled on Dexilant, Prevacid before dinner.  He saw cardiologist a few years ago.  No chest pain   EGD 2 or 3  EGD 10/16/2017:   Past Medical History:  Diagnosis Date   B12 deficiency    Colon polyp    Diverticulosis    GERD (gastroesophageal reflux disease)    Hyperlipidemia    Laryngopharyngeal reflux    Low testosterone    Prostate cancer ([place])    prostate   Tubular adenoma of colon 09/2017   Past Surgical History:  Procedure Laterality Date   BACK SURGERY  1985   neck   HIGH INTENSITY FOCUSED ULTRASOUND (HIFU) OF THE PROSTATE N/A 11/25/2021   Procedure: HIGH INTENSITY FOCUSED ULTRASOUND (HIFU) OF THE PROSTATE;  Surgeon: Darryl Cowper, MD;  Location: [facility];  Service: Urology;  Laterality: N/A;   PROSTATE BIOPSY N/A 07/22/2021   Procedure: PROSTATE BIOPSY Darryl King;  Surgeon: Darryl Cowper, MD;  Location: [facility];  Service: Urology;  Laterality: N/A;       Current Medications, Allergies, Past Medical History, Past Surgical History, Family History and Social History were reviewed in [employer] record.   Review of Systems:   Constitutional: Negative for fever, sweats, chills or weight loss.  Respiratory: Negative for shortness of breath.   Cardiovascular: Negative for chest pain, palpitations and leg swelling.  Gastrointestinal: See HPI.  Musculoskeletal: Negative for back pain or muscle aches.  Neurological: Negative for dizziness, headaches or paresthesias.    Physical Exam: BP 134/70 (BP Location: Left Arm, Patient Position: Sitting, Cuff Size: Normal)   Pulse 64   Ht '5\' 7"'$  (1.702 m)   Wt 171 lb 2 oz  (77.6 kg)   BMI 26.80 kg/m  General: in no acute distress. Head: Normocephalic and atraumatic. Eyes: No scleral icterus. Conjunctiva pink . Ears: Normal auditory acuity. Mouth: Dentition intact. No ulcers or lesions.  Lungs: Clear throughout to auscultation. Heart: Regular rate and rhythm, no murmur. Abdomen: Soft, nontender and nondistended. No masses or hepatomegaly. Normal bowel sounds x 4 quadrants.  Rectal: *** Musculoskeletal: Symmetrical with no gross deformities. Extremities: No edema. Neurological: Alert oriented x 4. No focal deficits.  Psychological: Alert and cooperative. Normal mood and affect  Assessment and Recommendations: ***

## 2022-10-21 NOTE — Patient Instructions (Addendum)
You have been scheduled for an endoscopy and colonoscopy. Please follow the written instructions given to you at your visit today. Please pick up your prep supplies at the pharmacy within the next 1-3 days. If you use inhalers (even only as needed), please bring them with you on the day of your procedure.  Due to recent changes in healthcare laws, you may see the results of your imaging and laboratory studies on MyChart before your provider has had a chance to review them.  We understand that in some cases there may be results that are confusing or concerning to you. Not all laboratory results come back in the same time frame and the provider may be waiting for multiple results in order to interpret others.  Please give [place] 48 hours in order for your provider to thoroughly review all the results before contacting the office for clarification of your results.    Thank you for trusting me with your gastrointestinal care!   Carl Best, CRNP

## 2022-10-23 DIAGNOSIS — Z86010 Personal history of colonic polyps: Secondary | ICD-10-CM | POA: Insufficient documentation

## 2022-10-23 DIAGNOSIS — K219 Gastro-esophageal reflux disease without esophagitis: Secondary | ICD-10-CM | POA: Insufficient documentation

## 2022-10-27 ENCOUNTER — Telehealth: Payer: Self-pay | Admitting: Nurse Practitioner

## 2022-10-27 MED ORDER — PEG 3350-KCL-NA BICARB-NACL 420 G PO SOLR: 4000.0000 mL | Freq: Once | ORAL | 0 refills | Status: AC

## 2022-10-27 NOTE — Telephone Encounter (Signed)
Ok to send in RX for golytely prep

## 2022-10-27 NOTE — Telephone Encounter (Signed)
Pharmacy has advised Suprep to expensive. Wants to have Golytely RX sent in. Please advise.

## 2022-10-28 ENCOUNTER — Other Ambulatory Visit: Payer: Self-pay

## 2022-10-28 DIAGNOSIS — Z86010 Personal history of colonic polyps: Secondary | ICD-10-CM

## 2022-10-28 MED ORDER — PEG 3350-KCL-NA BICARB-NACL 420 G PO SOLR: 4000.0000 mL | Freq: Once | ORAL | 0 refills | Status: AC

## 2022-10-28 NOTE — Telephone Encounter (Signed)
Pt wife Mardene Celeste  stated that the Suprep was going to be 80 dollars and that's more than they wanted to spend. Prescription for Golytely sent to pt pharmacy.  Pt already had letter sent to my chart for the Golytely prep instructions . Copied and pasted and sent again. Mardene Celeste made aware: Mardene Celeste verbalized understanding with all questions answered.

## 2022-10-28 NOTE — Telephone Encounter (Signed)
Spoke with pt wife Mardene Celeste  prior this AM. Prescription has been sent.  Documented in My Chart Message. Mardene Celeste verbalized understanding with all questions answered.

## 2022-10-31 NOTE — Progress Notes (Signed)
Addendum: Reviewed and agree with assessment and management plan. Ok for EGD with colon on same day for GERD If no spots on that day procedures can be done separately or moved to a new day based on patient preference Ercel Pepitone, Lajuan Lines, MD

## 2022-11-03 NOTE — Progress Notes (Signed)
Darryl King, pls inform patient that Dr. Hilarie Fredrickson reviewed his office consult and patient to proceed with EGD and colonoscopy as planned. THX

## 2022-11-07 ENCOUNTER — Telehealth: Payer: Self-pay

## 2022-11-07 NOTE — Telephone Encounter (Signed)
Message Received: 4 days ago Noralyn Pick, NP  Gillermina Hu, RN      Previous Messages  Routed Note  Author: Noralyn Pick, NP Service: Gastroenterology Author Type: Nurse Practitioner  Filed: 11/03/2022  6:36 PM Encounter Date: 10/21/2022 Status: Signed  Editor: Noralyn Pick, NP (Nurse Practitioner)  Remo Lipps, pls inform patient that Dr. Hilarie Fredrickson reviewed his office consult and patient to proceed with EGD and colonoscopy as planned. [place]     Office Visit for Gastroesophageal Reflux 10/21/2022 Noralyn Pick, NP - [facility] Diagnoses  Gastroesophageal Reflux Disease Without Esophagitis (Primary) History of colonic polyps Orders Signed This Visit  (2) Na Sulfate-K Sulfate-Mg Sulf 17.5-3.13-1.6 GM/177ML SOLN Ambulatory referral to Gastroenterology Orders Pended This Visit  None Progress Notes  Noralyn Pick, NP at 10/21/2022 10:00 AM  Status: Signed  Remo Lipps, pls inform patient that Dr. Hilarie Fredrickson reviewed his office consult and patient to proceed with EGD and colonoscopy as planned. Richardson Landry, MD at 10/21/2022 10:00 AM  Status: Signed  Addendum: Reviewed and agree with assessment and management plan. Ok for EGD with colon on same day for GERD If no spots on that day procedures can be done separately or moved to a new day based on patient preference Pyrtle, Lajuan Lines, MD

## 2022-11-07 NOTE — Telephone Encounter (Signed)
Pt wife Mardene Celeste  made aware of Dr. Hilarie Fredrickson recommendations.  Chart reviewed. Pt has been previously been scheduled for EGD and Colon on 12/22/2022 at 11:00 with Dr. Hilarie Fredrickson. Mardene Celeste aware:  Instructions have been sent. Ambulatory referral has been sent Mardene Celeste verbalized understanding with all questions answered.

## 2022-11-21 ENCOUNTER — Encounter: Payer: Medicare Other | Admitting: Internal Medicine

## 2022-12-07 ENCOUNTER — Ambulatory Visit: Payer: Self-pay | Admitting: Urology

## 2022-12-13 ENCOUNTER — Encounter: Payer: Self-pay | Admitting: Internal Medicine

## 2022-12-22 ENCOUNTER — Ambulatory Visit (AMBULATORY_SURGERY_CENTER): Payer: Medicare Other | Admitting: Internal Medicine

## 2022-12-22 ENCOUNTER — Encounter: Payer: Self-pay | Admitting: Internal Medicine

## 2022-12-22 VITALS — BP 128/74 | HR 53 | Temp 98.2°F | Resp 10 | Ht 67.0 in | Wt 171.0 lb

## 2022-12-22 DIAGNOSIS — D124 Benign neoplasm of descending colon: Secondary | ICD-10-CM | POA: Diagnosis not present

## 2022-12-22 DIAGNOSIS — R0789 Other chest pain: Secondary | ICD-10-CM | POA: Diagnosis not present

## 2022-12-22 DIAGNOSIS — K2289 Other specified disease of esophagus: Secondary | ICD-10-CM

## 2022-12-22 DIAGNOSIS — D125 Benign neoplasm of sigmoid colon: Secondary | ICD-10-CM

## 2022-12-22 DIAGNOSIS — K449 Diaphragmatic hernia without obstruction or gangrene: Secondary | ICD-10-CM | POA: Diagnosis not present

## 2022-12-22 DIAGNOSIS — Z09 Encounter for follow-up examination after completed treatment for conditions other than malignant neoplasm: Secondary | ICD-10-CM

## 2022-12-22 DIAGNOSIS — K317 Polyp of stomach and duodenum: Secondary | ICD-10-CM

## 2022-12-22 DIAGNOSIS — K229 Disease of esophagus, unspecified: Secondary | ICD-10-CM

## 2022-12-22 DIAGNOSIS — Z86010 Personal history of colonic polyps: Secondary | ICD-10-CM

## 2022-12-22 DIAGNOSIS — K219 Gastro-esophageal reflux disease without esophagitis: Secondary | ICD-10-CM

## 2022-12-22 DIAGNOSIS — D122 Benign neoplasm of ascending colon: Secondary | ICD-10-CM

## 2022-12-22 DIAGNOSIS — K2950 Unspecified chronic gastritis without bleeding: Secondary | ICD-10-CM | POA: Diagnosis not present

## 2022-12-22 MED ORDER — SODIUM CHLORIDE 0.9 % IV SOLN: 500.0000 mL | Freq: Once | INTRAVENOUS | Status: DC

## 2022-12-22 NOTE — Progress Notes (Signed)
GASTROENTEROLOGY PROCEDURE H&P NOTE   Primary Care Physician: Danella Penton, MD    Reason for Procedure:  GERD and history of adenomatous colon polyp  Plan:    EGD and colonoscopy  Patient is appropriate for endoscopic procedure(s) in the ambulatory ([facility]) setting.  The nature of the procedure, as well as the risks, benefits, and alternatives were carefully and thoroughly reviewed with the patient. Ample time for discussion and questions allowed. The patient understood, was satisfied, and agreed to proceed.     HPI: Darryl King is a 72 y.o. male who presents for EGD and colonoscopy.  Medical history as below.  Tolerated the prep.  No recent chest pain or shortness of breath.  No abdominal pain today.  Past Medical History:  Diagnosis Date   B12 deficiency    Colon polyp    Diverticulosis    GERD (gastroesophageal reflux disease)    Hyperlipidemia    pt denies, no meds   Laryngopharyngeal reflux    Low testosterone    Prostate cancer    prostate   Tubular adenoma of colon 09/2017    Past Surgical History:  Procedure Laterality Date   BACK SURGERY  1985   neck   HIGH INTENSITY FOCUSED ULTRASOUND (HIFU) OF THE PROSTATE N/A 11/25/2021   Procedure: HIGH INTENSITY FOCUSED ULTRASOUND (HIFU) OF THE PROSTATE;  Surgeon: Orson Ape, MD;  Location: [facility] ORS;  Service: Urology;  Laterality: N/A;   PROSTATE BIOPSY N/A 07/22/2021   Procedure: PROSTATE BIOPSY Addison Bailey;  Surgeon: Orson Ape, MD;  Location: [facility] ORS;  Service: Urology;  Laterality: N/A;    Prior to Admission medications   Medication Sig Start Date End Date Taking? Authorizing Provider  ALPRAZolam Prudy Feeler) 1 MG tablet Take 0.5 mg by mouth daily as needed for anxiety. 10/11/21  Yes [provider]  Calcium Carb-Cholecalciferol (OYSTER SHELL CALCIUM W/D) 500-5 MG-MCG TABS Take 1 tablet by mouth daily.   Yes [provider]  cholecalciferol (VITAMIN D3) 25 MCG (1000 UNIT) tablet Take 1,000  Units by mouth daily.   Yes [provider]  cyanocobalamin (VITAMIN B12) 1000 MCG tablet Take 1 tablet by mouth daily.   Yes [provider]  famotidine (PEPCID) 40 MG tablet Take 40 mg by mouth at bedtime.  02/18/20  Yes [provider]  fluticasone (FLONASE) 50 MCG/ACT nasal spray Place 1 spray into both nostrils daily as needed for allergies or rhinitis.   Yes [provider]  lansoprazole (PREVACID) 30 MG capsule Take 1 capsule (30 mg total) by mouth 2 (two) times daily before a meal. 11/12/21  Yes Hersh Minney, Carie Caddy, MD  levofloxacin (LEVAQUIN) 500 MG tablet Take 500 mg by mouth daily.   Yes [provider]  Magnesium 250 MG TABS Take 500 mg by mouth daily.   Yes [provider]  SUPER B COMPLEX/C PO Take 1 tablet by mouth daily.   Yes [provider]  temazepam (RESTORIL) 30 MG capsule Take 10-30 mg by mouth at bedtime as needed for sleep. 03/19/20  Yes [provider]  meloxicam (MOBIC) 15 MG tablet Take 15 mg by mouth daily as needed for pain. 03/19/20   [provider]  Saw Palmetto, Serenoa repens, (SAW PALMETTO PO) Take 2 tablets by mouth daily. Patient not taking: Reported on 12/22/2022    [provider]    Current Outpatient Medications  Medication Sig Dispense Refill   ALPRAZolam (XANAX) 1 MG tablet Take 0.5 mg by mouth  daily as needed for anxiety.     Calcium Carb-Cholecalciferol (OYSTER SHELL CALCIUM W/D) 500-5 MG-MCG TABS Take 1 tablet by mouth daily.     cholecalciferol (VITAMIN D3) 25 MCG (1000 UNIT) tablet Take 1,000 Units by mouth daily.     cyanocobalamin (VITAMIN B12) 1000 MCG tablet Take 1 tablet by mouth daily.     famotidine (PEPCID) 40 MG tablet Take 40 mg by mouth at bedtime.      fluticasone (FLONASE) 50 MCG/ACT nasal spray Place 1 spray into both nostrils daily as needed for allergies or rhinitis.     lansoprazole (PREVACID) 30 MG capsule Take 1 capsule (30 mg total) by mouth 2 (two)  times daily before a meal. 60 capsule 11   levofloxacin (LEVAQUIN) 500 MG tablet Take 500 mg by mouth daily.     Magnesium 250 MG TABS Take 500 mg by mouth daily.     SUPER B COMPLEX/C PO Take 1 tablet by mouth daily.     temazepam (RESTORIL) 30 MG capsule Take 10-30 mg by mouth at bedtime as needed for sleep.     meloxicam (MOBIC) 15 MG tablet Take 15 mg by mouth daily as needed for pain.     Saw Palmetto, Serenoa repens, (SAW PALMETTO PO) Take 2 tablets by mouth daily. (Patient not taking: Reported on 12/22/2022)     Current Facility-Administered Medications  Medication Dose Route Frequency Provider Last Rate Last Admin   0.9 %  sodium chloride infusion  500 mL Intravenous Once Shafiq Larch, Carie CaddyJay M, MD        Allergies as of 12/22/2022   (No Known Allergies)    Family History  Problem Relation Age of Onset   Ulcers Mother    Colon cancer Father    Colon polyps Father    Esophageal cancer Neg Hx    Rectal cancer Neg Hx    Stomach cancer Neg Hx     Social History   Socioeconomic History   Marital status: Married    Spouse name: Elease Hashimotoatricia   Number of children: 0   Years of education: Not on file   Highest education level: Not on file  Occupational History   Occupation: retired  Tobacco Use   Smoking status: Never   Smokeless tobacco: Never  Vaping Use   Vaping Use: Never used  Substance and Sexual Activity   Alcohol use: Yes    Alcohol/week: 1.0 - 2.0 standard drink of alcohol    Types: 1 - 2 Glasses of wine per week    Comment: 1-2 drink some nights   Drug use: Never   Sexual activity: Not on file  Other Topics Concern   Not on file  Social History Narrative   Married.     Social Determinants of Health   Financial Resource Strain: Not on file  Food Insecurity: Not on file  Transportation Needs: Not on file  Physical Activity: Not on file  Stress: Not on file  Social Connections: Not on file  Intimate Partner Violence: Not on file    Physical Exam: Vital signs  in last 24 hours: @BP  (!) 140/75   Pulse 74   Temp 98.2 F (36.8 C) (Temporal)   Ht 5\' 7"  (1.702 m)   Wt 171 lb (77.6 kg)   SpO2 97%   BMI 26.78 kg/m  GEN: NAD EYE: Sclerae anicteric ENT: MMM CV: Non-tachycardic Pulm: CTA b/l GI: Soft, NT/ND NEURO:  Alert & Oriented x 3   Erick BlinksJay Hser Belanger, MD [facility]  12/22/2022 10:49 AM

## 2022-12-22 NOTE — Op Note (Addendum)
[name] Patient Name: Darryl King Procedure Date: 12/22/2022 10:56 AM MRN: [id number] Endoscopist: Beverley Fiedler , MD, [id number] Age: 72 Referring MD:  Date of Birth: April 08, 1951 Gender: Male Account #: [id number] Procedure:                Upper GI endoscopy Indications:              Gastro-esophageal reflux disease, Chest pain (non                            cardiac) at xyphoid Medicines:                Monitored Anesthesia Care Procedure:                Pre-Anesthesia Assessment:                           - Prior to the procedure, a History and Physical                            was performed, and patient medications and                            allergies were reviewed. The patient's tolerance of                            previous anesthesia was also reviewed. The risks                            and benefits of the procedure and the sedation                            options and risks were discussed with the patient.                            All questions were answered, and informed consent                            was obtained. Prior Anticoagulants: The patient has                            taken no anticoagulant or antiplatelet agents. ASA                            Grade Assessment: II - A patient with mild systemic                            disease. After reviewing the risks and benefits,                            the patient was deemed in satisfactory condition to                            undergo the procedure.  After obtaining informed consent, the endoscope was                            passed under direct vision. Throughout the                            procedure, the patient's blood pressure, pulse, and                            oxygen saturations were monitored continuously. The                            Olympus Scope [id number] was introduced through the                            mouth, and advanced to the second  part of duodenum.                            The upper GI endoscopy was accomplished without                            difficulty. The patient tolerated the procedure                            well. Scope In: Scope Out: Findings:                 The Z-line was irregular and was found 39 cm from                            the incisors. Biopsies were taken with a cold                            forceps for histology.                           A 4 cm hiatal hernia was present.                           The gastroesophageal flap valve was visualized                            endoscopically and classified as Hill Grade IV (no                            fold, wide open lumen, hiatal hernia present).                           The entire examined stomach was normal.                           A single 6 mm sessile polyp was found in the second                            portion  of the duodenum. The polyp was removed with                            a cold snare. Resection and retrieval were complete.                           The exam of the duodenum was otherwise normal. Complications:            No immediate complications. Estimated Blood Loss:     Estimated blood loss was minimal. Impression:               - Z-line irregular, 39 cm from the incisors.                            Biopsied.                           - 4 cm hiatal hernia.                           - Normal stomach.                           - A single duodenal polyp. Resected and retrieved. Recommendation:           - Patient has a contact number available for                            emergencies. The signs and symptoms of potential                            delayed complications were discussed with the                            patient. Return to normal activities tomorrow.                            Written discharge instructions were provided to the                            patient.                           - Resume  previous diet.                           - Continue present medications. Continue                            lansoprazole 30 mg dailoy. OTC famotidine (Pepcid)                            20 mg twice daily can be used as needed for                            breakthrough heartburn or reflux related chest pain.                           -  Await pathology results. Beverley Fiedler, MD 12/22/2022 11:36:17 AM This report has been signed electronically.

## 2022-12-22 NOTE — Patient Instructions (Signed)
Continue with current PPI regiment   YOU HAD AN ENDOSCOPIC PROCEDURE TODAY: Refer to the procedure report and other information in the discharge instructions given to you for any specific questions about what was found during the examination. If this information does not answer your questions, please call [place] office at [phone] to clarify.   YOU SHOULD EXPECT: Some feelings of bloating in the abdomen. Passage of more gas than usual. Walking can help get rid of the air that was put into your GI tract during the procedure and reduce the bloating. If you had a lower endoscopy (such as a colonoscopy or flexible sigmoidoscopy) you may notice spotting of blood in your stool or on the toilet paper. Some abdominal soreness may be present for a day or two, also.  DIET: Your first meal following the procedure should be a light meal and then it is ok to progress to your normal diet. A half-sandwich or bowl of soup is an example of a good first meal. Heavy or fried foods are harder to digest and may make you feel nauseous or bloated. Drink plenty of fluids but you should avoid alcoholic beverages for 24 hours. If you had a esophageal dilation, please see attached instructions for diet.    ACTIVITY: Your care partner should take you home directly after the procedure. You should plan to take it easy, moving slowly for the rest of the day. You can resume normal activity the day after the procedure however YOU SHOULD NOT DRIVE, use power tools, machinery or perform tasks that involve climbing or major physical exertion for 24 hours (because of the sedation medicines used during the test).   SYMPTOMS TO REPORT IMMEDIATELY: A gastroenterologist can be reached at any hour. Please call [phone]  for any of the following symptoms:  Following lower endoscopy (colonoscopy, flexible sigmoidoscopy) Excessive amounts of blood in the stool  Significant tenderness, worsening of abdominal pains  Swelling of the  abdomen that is new, acute  Fever of 100 or higher  Following upper endoscopy (EGD, EUS, ERCP, esophageal dilation) Vomiting of blood or coffee ground material  New, significant abdominal pain  New, significant chest pain or pain under the shoulder blades  Painful or persistently difficult swallowing  New shortness of breath  Black, tarry-looking or red, bloody stools  FOLLOW UP:  If any biopsies were taken you will be contacted by phone or by letter within the next 1-3 weeks. Call [phone]  if you have not heard about the biopsies in 3 weeks.  Please also call with any specific questions about appointments or follow up tests.

## 2022-12-22 NOTE — Op Note (Signed)
[facility] Patient Name: Darryl King Procedure Date: 12/22/2022 10:55 AM MRN: [id number] Endoscopist: Beverley Fiedler , MD, [id number] Age: 72 Referring MD:  Date of Birth: Mar 17, 1951 Gender: Male Account #: [id number] Procedure:                Colonoscopy Indications:              High risk colon cancer surveillance: Personal                            history of non-advanced adenoma, Last colonoscopy:                            February 2019 [facility]) Medicines:                Monitored Anesthesia Care Procedure:                Pre-Anesthesia Assessment:                           - Prior to the procedure, a History and Physical                            was performed, and patient medications and                            allergies were reviewed. The patient's tolerance of                            previous anesthesia was also reviewed. The risks                            and benefits of the procedure and the sedation                            options and risks were discussed with the patient.                            All questions were answered, and informed consent                            was obtained. Prior Anticoagulants: The patient has                            taken no anticoagulant or antiplatelet agents. ASA                            Grade Assessment: II - A patient with mild systemic                            disease. After reviewing the risks and benefits,                            the patient was deemed in satisfactory condition to  undergo the procedure.                           After obtaining informed consent, the colonoscope                            was passed under direct vision. Throughout the                            procedure, the patient's blood pressure, pulse, and                            oxygen saturations were monitored continuously. The                            CF [id number] #[id number] was introduced  through the anus                            and advanced to the cecum, identified by                            appendiceal orifice and ileocecal valve. The                            colonoscopy was performed without difficulty. The                            patient tolerated the procedure well. The quality                            of the bowel preparation was good. The ileocecal                            valve, appendiceal orifice, and rectum were                            photographed. Scope In: 11:16:28 AM Scope Out: 11:31:06 AM Scope Withdrawal Time: 0 hours 11 minutes 57 seconds  Total Procedure Duration: 0 hours 14 minutes 38 seconds  Findings:                 The digital rectal exam was normal.                           Three sessile polyps were found in the ascending                            colon. The polyps were 3 to 6 mm in size. These                            polyps were removed with a cold snare. Resection                            and retrieval were complete.  A 7 mm polyp was found in the descending colon. The                            polyp was sessile. The polyp was removed with a                            cold snare. Resection and retrieval were complete.                           A 5 mm polyp was found in the sigmoid colon. The                            polyp was sessile. The polyp was removed with a                            cold snare. Resection and retrieval were complete.                           Multiple large-mouthed, medium-mouthed and                            small-mouthed diverticula were found in the sigmoid                            colon, descending colon and ascending colon.                           Internal hemorrhoids were found during                            retroflexion. The hemorrhoids were medium-sized. Complications:            No immediate complications. Estimated Blood Loss:     Estimated blood loss  was minimal. Impression:               - Three 3 to 6 mm polyps in the ascending colon,                            removed with a cold snare. Resected and retrieved.                           - One 7 mm polyp in the descending colon, removed                            with a cold snare. Resected and retrieved.                           - One 5 mm polyp in the sigmoid colon, removed with                            a cold snare. Resected and retrieved.                           -  Moderate diverticulosis in the sigmoid colon, in                            the descending colon and in the ascending colon.                           - Internal hemorrhoids. Recommendation:           - Patient has a contact number available for                            emergencies. The signs and symptoms of potential                            delayed complications were discussed with the                            patient. Return to normal activities tomorrow.                            Written discharge instructions were provided to the                            patient.                           - Resume previous diet.                           - Continue present medications.                           - Await pathology results.                           - Repeat colonoscopy is recommended for                            surveillance. The colonoscopy date will be                            determined after pathology results from today's                            exam become available for review. Beverley FiedlerJay M Troyce Gieske, MD 12/22/2022 11:38:43 AM This report has been signed electronically.

## 2022-12-22 NOTE — Progress Notes (Signed)
Called to room to assist during endoscopic procedure.  Patient ID and intended procedure confirmed with present staff. Received instructions for my participation in the procedure from the performing physician.  

## 2022-12-22 NOTE — Progress Notes (Signed)
Report to PACU, RN, vss, BBS= Clear.  

## 2022-12-23 ENCOUNTER — Telehealth: Payer: Self-pay

## 2022-12-23 NOTE — Telephone Encounter (Signed)
  Follow up Call-     12/22/2022   10:26 AM  Call back number  Post procedure Call Back phone  # [phone]  Permission to leave phone message Yes     Patient questions:  Do you have a fever, pain , or abdominal swelling? No. Pain Score  0 *  Have you tolerated food without any problems? Yes.    Have you been able to return to your normal activities? Yes.    Do you have any questions about your discharge instructions: Diet   No. Medications  No. Follow up visit  No.  Do you have questions or concerns about your Care? No.  Actions: * If pain score is 4 or above: No action needed, pain <4.

## 2022-12-27 ENCOUNTER — Encounter: Payer: Self-pay | Admitting: Internal Medicine

## 2023-01-04 ENCOUNTER — Ambulatory Visit: Payer: Medicare Other | Admitting: Urology

## 2023-01-11 ENCOUNTER — Encounter: Payer: Self-pay | Admitting: Urology

## 2023-01-11 ENCOUNTER — Ambulatory Visit: Payer: Medicare Other | Admitting: Urology

## 2023-01-11 VITALS — BP 147/77 | HR 74 | Ht 67.0 in | Wt 169.0 lb

## 2023-01-11 DIAGNOSIS — N529 Male erectile dysfunction, unspecified: Secondary | ICD-10-CM

## 2023-01-11 DIAGNOSIS — C61 Malignant neoplasm of prostate: Secondary | ICD-10-CM

## 2023-01-11 NOTE — Patient Instructions (Addendum)
Please stop by the lab in [address] on 03/13/23 for a PSA check   Hydrocele, Adult A hydrocele is a collection of fluid in the loose pouch of skin that holds the testicles (scrotum). It can occur in one or both testicles. This may happen because: The amount of fluid produced in the scrotum is not absorbed by the rest of the body. Fluid from the abdomen fills the scrotum. Normally, the testicles develop in the abdomen and then drop into the scrotum before birth. The tube that the testicles travel through usually closes after the testicles drop. If the tube does not close, fluid from the abdomen can fill the scrotum. This is not very common in adults. What are the causes? A hydrocele may be caused by: An injury to the scrotum. An infection. Decreased blood flow to the scrotum. Twisting of a testicle (testicular torsion). A birth defect. A tumor or cancer of the testicle. Sometimes, the cause is not known. What are the signs or symptoms? A hydrocele feels like a water-filled balloon. It may also feel heavy. Other symptoms include: Swelling of the scrotum. The swelling may decrease when you lie down. You may also notice more swelling at night than in the morning. This is called a communicating hydrocele, in which the fluid in the scrotum goes back into the abdominal cavity when the position of the scrotum changes. Swelling of the groin. Mild discomfort in the scrotum. Pain. This can develop if the hydrocele was caused by infection or twisting. The larger the hydrocele, the more likely you are to have pain. Swelling may also cause pain. How is this diagnosed? This condition may be diagnosed based on a physical exam and your medical history. You may also have tests, including: Imaging tests, such as an ultrasound. A transillumination test. This test takes place in a dark room where a light is placed on the skin of the scrotum. Clear liquid will not impede the light and the scrotum will be  illuminated. This helps a health care provider distinguish a hydrocele from a tumor. Blood or urine tests. How is this treated? Most hydroceles go away on their own. If you have no discomfort or pain, your health care provider may suggest close monitoring of your condition until the condition goes away or symptoms develop. This is called watch and wait or watchful waiting. If treatment is needed, it may include: Treating an underlying condition. This may include taking an antibiotic medicine to treat an infection. Having surgery to stop fluid from collecting in the scrotum. Having surgery to drain the fluid. Surgery may include: Hydrocelectomy. For this procedure, an incision is made in the scrotum to remove the fluid. Needle aspiration. A needle is used to drain fluid. However, the fluid buildup will come back quickly and may lead to an infection of the scrotum. This is rarely done. Follow these instructions at home: Medicines Take over-the-counter and prescription medicines only as told by your health care provider. If you were prescribed an antibiotic medicine, take it as told by your health care provider. Do not stop taking the antibiotic even if you start to feel better. General instructions Watch the hydrocele for any changes. Keep all follow-up visits. This is important. Contact a health care provider if: You notice any changes in the hydrocele. The swelling in your scrotum or groin gets worse. The hydrocele becomes red, firm, painful, or tender to the touch. You have a fever. Get help right away if you: Develop a lot of  pain or your pain becomes worse. Have chills. Have a high fever. Summary A hydrocele is a collection of fluid in the loose pouch of skin that holds the testicles (scrotum). A hydrocele can cause swelling, discomfort, and pain. In adults, the cause of a hydrocele may not be known. However, it is sometimes caused by an infection or the twisting of a  testicle. Hydroceles often go away on their own. If a hydrocele causes pain, treating the underlying cause may be needed to ease the pain. This information is not intended to replace advice given to you by your health care provider. Make sure you discuss any questions you have with your health care provider. Document Revised: 04/15/2021 Document Reviewed: 04/15/2021 Elsevier Patient Education  2023 [employer]  Hydrocelectomy, Adult  A hydrocelectomy is a surgical procedure to remove a collection of fluid (hydrocele) from the scrotum. The scrotum is the pouch that holds the testicles. You may need to have this procedure if a hydrocele is causing painful swelling in your scrotum. Tell a health care provider about: Any allergies you have. All medicines you are taking, including vitamins, herbs, eye drops, creams, and over-the-counter medicines. Any problems you or family members have had with anesthetic medicines. Any bleeding problems you have. Any surgeries you have had. Any medical conditions you have. What are the risks? Generally, this is a safe procedure. However, problems may occur, including: Bleeding into the scrotum (scrotal hematoma). Damage to nearby structures or organs, including to the testicle or the tube that carries sperm out of the testicle (vas deferens). Infection. Allergic reactions to medicines. What happens before the procedure? When to stop eating and drinking Follow instructions from your health care provider about what you may eat and drink before your procedure. These may include: 8 hours before your procedure Stop eating most foods. Do not eat meat, fried foods, or fatty foods. Eat only light foods, such as toast or crackers. All liquids are okay except energy drinks and alcohol. 6 hours before your procedure Stop eating. Drink only clear liquids, such as water, clear fruit juice, black coffee, plain tea, and sports drinks. Do not drink energy drinks or  alcohol. 2 hours before your procedure Stop drinking all liquids. You may be allowed to take medicines with small sips of water. If you do not follow your health care provider's instructions, your procedure may be delayed or canceled. Medicines Ask your health care provider about: Changing or stopping your regular medicines. This is especially important if you are taking diabetes medicines or blood thinners. Taking medicines such as aspirin and ibuprofen. These medicines can thin your blood. Do not take these medicines unless your health care provider tells you to take them. Taking over-the-counter medicines, vitamins, herbs, and supplements. Surgery safety Ask your health care provider: How your surgery site will be marked. What steps will be taken to help prevent infection. These steps may include: Removing hair at the surgery site. Washing skin with a germ-killing soap. Taking antibiotic medicine. General instructions Do not use any products that contain nicotine or tobacco for at least 4 weeks before the procedure. These products include cigarettes, chewing tobacco, and vaping devices, such as e-cigarettes. If you need help quitting, ask your health care provider. If you will be going home right after the procedure, plan to have a responsible adult: Take you home from the hospital or clinic. You will not be allowed to drive. Care for you for the time you are told. What happens during the  procedure? An IV will be inserted into one of your veins. You will be given one or both of the following: A medicine to make you relax (sedative). A medicine to make you fall asleep (general anesthetic). A small incision will be made through the skin of your scrotum. Your testicle and the hydrocele will be located, and the hydrocele sac will be opened with an incision. The fluid will be drained from the hydrocele. Part of the hydrocele sac may be removed. The hydrocele will be closed with stitches  that dissolve (absorbable sutures). This prevents fluid from building up again. If your hydrocele is large, you may have a thin, rubber drain placed to allow fluid to drain after the procedure. The incision in your scrotum will be closed with absorbable sutures, skin glue, or adhesive strips. A bandage (dressing) will be placed over the incision. The dressing may be held in place with an athletic support strap (scrotal support). The procedure may vary among health care providers and hospitals. What happens after the procedure?  Your blood pressure, heart rate, breathing rate, and blood oxygen level will be monitored until you leave the hospital or clinic. You will be given pain medicine as needed. Your IV will be removed, and your insertion site will be checked for bleeding. You may need to wear a scrotal support. This holds the dressing in place and supports your scrotum. If you were given a sedative during the procedure, it can affect you for several hours. Do not drive or operate machinery until your health care provider says that it is safe. Summary A hydrocelectomy is a surgical procedure to remove a collection of fluid from the scrotum. You may need to have this procedure if a hydrocele is causing painful swelling in your scrotum. During the procedure, the hydrocele will be drained and then closed with stitches that dissolve (absorbable sutures). This prevents fluid from building up again. If your hydrocele is large, you may have a thin, rubber drain placed to allow fluid to drain after the procedure. You may need to wear a scrotal support after your procedure. This holds the dressing in place and supports your scrotum. This information is not intended to replace advice given to you by your health care provider. Make sure you discuss any questions you have with your health care provider. Document Revised: 04/15/2021 Document Reviewed: 04/15/2021 Elsevier Patient Education  2023 [employer].

## 2023-01-11 NOTE — Progress Notes (Signed)
01/11/23 12:18 PM   Darryl King [birth year] [id number]  CC: Prostate cancer, ED, hydrocele  HPI: 72 year old male transferring his care from Dr. Sheppard Penton for the above issues.  He was diagnosed with low-volume favorable intermediate risk prostate cancer in November 2022, and ultimately underwent HIFU with Dr. Sheppard Penton on 11/25/2021.  Prostate volume was 56 g, and he was apparently pre-treated with Orgovyx prior to HIFU.  PSA on 12/08/2021(after being on Orgovyx) was 0.51, 1.6 in September 2023, 1.17 August 2022, and most recently 1.97 on 12/12/2022.  He denies any urinary symptoms or side effects from HIFU.  He has ED and has previously been on Trimix through Dr. Sheppard Penton which works well.  He also reports a right-sided hydrocele that has been drained multiple times by Dr. Sheppard Penton and recurred relatively quickly.  He reports some right scrotal swelling but it is not bothersome enough to consider intervention at this time.  He denies any gross hematuria or dysuria.   PMH: Past Medical History:  Diagnosis Date   B12 deficiency    Colon polyp    Diverticulosis    GERD (gastroesophageal reflux disease)    Hyperlipidemia    pt denies, no meds   Laryngopharyngeal reflux    Low testosterone    Prostate cancer (HCC)    prostate   Tubular adenoma of colon 09/2017    Surgical History: Past Surgical History:  Procedure Laterality Date   BACK SURGERY  1985   neck   HIGH INTENSITY FOCUSED ULTRASOUND (HIFU) OF THE PROSTATE N/A 11/25/2021   Procedure: HIGH INTENSITY FOCUSED ULTRASOUND (HIFU) OF THE PROSTATE;  Surgeon: Orson Ape, MD;  Location: [facility];  Service: Urology;  Laterality: N/A;   PROSTATE BIOPSY N/A 07/22/2021   Procedure: PROSTATE BIOPSY Addison Bailey;  Surgeon: Orson Ape, MD;  Location: [facility];  Service: Urology;  Laterality: N/A;      Family History: Family History  Problem Relation Age of Onset   Ulcers Mother    Colon cancer Father    Colon polyps Father    Esophageal  cancer Neg Hx    Rectal cancer Neg Hx    Stomach cancer Neg Hx     Social History:  reports that he has never smoked. He has never been exposed to tobacco smoke. He has never used smokeless tobacco. He reports current alcohol use of about 1.0 - 2.0 standard drink of alcohol per week. He reports that he does not use drugs.  Physical Exam: BP (!) 147/77   Pulse 74   Ht 5\' 7"  (1.702 m)   Wt 169 lb (76.7 kg)   BMI 26.47 kg/m    Constitutional:  Alert and oriented, No acute distress. Cardiovascular: No clubbing, cyanosis, or edema. Respiratory: Normal respiratory effort, no increased work of breathing. GI: Abdomen is soft, nontender, nondistended, no abdominal masses  Laboratory Data: Reviewed, see HPI  Assessment & Plan:   72 year old male previously followed by Dr. Sheppard Penton for favorable intermediate risk prostate cancer with HIFU in March 2023, ED on Trimix, and right hydrocele with multiple aspirations.  Outside records reviewed at length.  In terms of his prostate cancer and HIFU treatment, we reviewed the controversies surrounding HIFU as well as follow-up treatment protocols and need to monitor the PSA trend.  PSA is increased to 1.97 from 1.6 previously, and I recommended a repeat PSA in 4 months.  We discussed possible need for MRI and biopsy if PSA continues to rise.  He is fairly adamant he  would like to proceed with a repeat HIFU if he is found to have recurrence, and has a urologist in mind in [place] he would want to follow-up with.  In terms of the right scrotal swelling, not bothersome enough to consider intervention at this time, can consider scrotal ultrasound prior to considering definitive hydrocelectomy for confirmation.  Okay to continue Trimix for ED  RTC 4 months PSA prior  Legrand Rams, MD 01/11/2023  [facility] [address], [address] [place], [place] [address] [phone]

## 2023-02-18 ENCOUNTER — Other Ambulatory Visit: Payer: Self-pay | Admitting: Internal Medicine

## 2023-02-28 ENCOUNTER — Other Ambulatory Visit: Payer: Self-pay | Admitting: Internal Medicine

## 2023-03-27 ENCOUNTER — Other Ambulatory Visit
Admission: RE | Admit: 2023-03-27 | Discharge: 2023-03-27 | Disposition: A | Discharge status: Home / Self Care | Payer: Medicare Other | Attending: Urology | Admitting: Urology

## 2023-03-27 DIAGNOSIS — C61 Malignant neoplasm of prostate: Secondary | ICD-10-CM | POA: Insufficient documentation

## 2023-03-27 LAB — PSA: Prostatic Specific Antigen: 1.7 ng/mL (ref 0.00–4.00)

## 2023-03-28 ENCOUNTER — Other Ambulatory Visit: Payer: Self-pay

## 2023-03-28 ENCOUNTER — Telehealth: Payer: Self-pay

## 2023-03-28 DIAGNOSIS — C61 Malignant neoplasm of prostate: Secondary | ICD-10-CM

## 2023-03-28 NOTE — Telephone Encounter (Signed)
Called pt informed him of the information below. Pt voiced understanding. Appt scheduled. PSA ordered.

## 2023-03-28 NOTE — Telephone Encounter (Signed)
-----   Message from Sondra Come sent at 03/28/2023  8:02 AM EDT ----- Good news, PSA is stable at 1.7.  Recommend follow-up in 6 months with PSA prior  Legrand Rams, MD 03/28/2023

## 2023-03-29 IMAGING — MR MR PROSTATE WO/W CM
56 series · 56 of 56 positions shown · IV contrast (gadavist)
Comparison: 06/14/2019

CLINICAL DATA: Elevated PSA level of 3.99.

EXAM:
MR PROSTATE WITHOUT AND WITH CONTRAST
TECHNIQUE: Multiplanar multisequence MRI images were obtained of the pelvis
centered about the prostate. Pre and post contrast images were
obtained.
CONTRAST:  7mL GADAVIST GADOBUTROL 1 MMOL/ML IV SOLN

[Series 5: ax in&out whole · axial · 6.0mm · 0.74mm/px · 1 of 35 slices shown (1 of 2)]
[im 1/35]
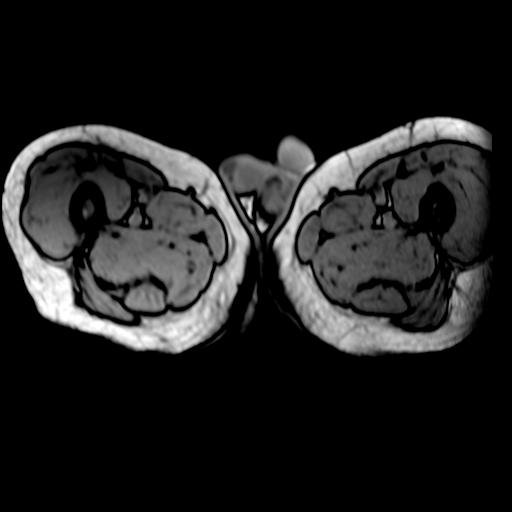

[Series 5: ax in&out whole · axial · 6.0mm · 0.74mm/px · 1 of 35 slices shown (2 of 2)]
[im 1/35]
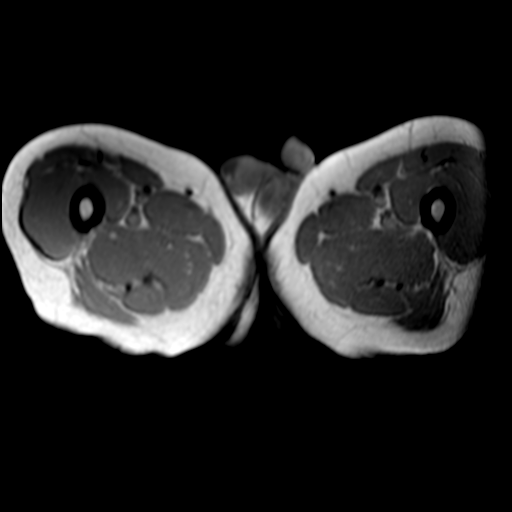

[Series 6: T2 · coronal · 3.0mm · 0.70mm/px · 1 of 35 slices shown (1 of 3)]
[im 1/35]
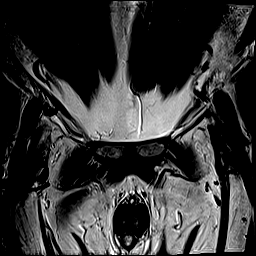

[Series 7: T2 · axial · 3.0mm · 0.56mm/px · 1 of 25 slices shown (2 of 3)]
[im 1/25]
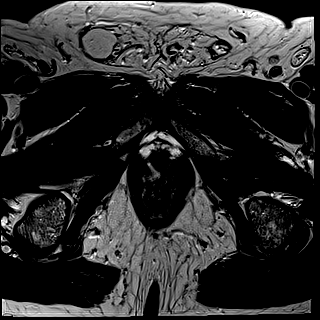

[Series 8: DWI · axial · 3.0mm · 0.86mm/px · 1 of 75 slices shown (1 of 3)]
[im 1/75]
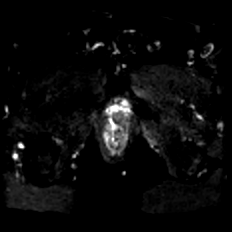

[Series 9: DWI · axial · 3.0mm · 0.86mm/px · 1 of 25 slices shown (2 of 3)]
[im 1/25]
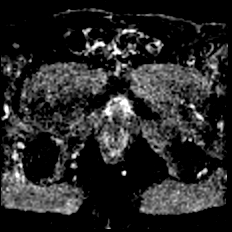

[Series 10: DWI · axial · 3.0mm · 0.86mm/px · 1 of 25 slices shown (3 of 3)]
[im 1/25]
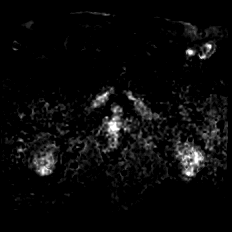

[Series 11: T2 · axial · 1.0mm · 1.04mm/px · 1 of 80 slices shown (3 of 3)]
[im 1/80]
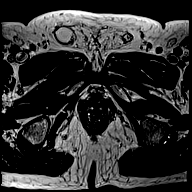

[Series 12: T1 · axial · 3.0mm · 1.15mm/px · 1 of 28 slices shown (1 of 48)]
[im 1/28]
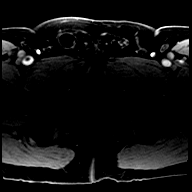

[Series 13: T1 · axial · 3.0mm · 1.15mm/px · 1 of 28 slices shown (2 of 48)]
[im 1/28]
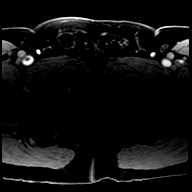

[Series 14: T1 · axial · 3.0mm · 1.15mm/px · 1 of 27 slices shown (3 of 48)]
[im 1/27]
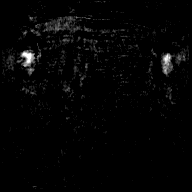

[Series 15: T1 · axial · 3.0mm · 1.15mm/px · 1 of 28 slices shown (4 of 48)]
[im 1/28]
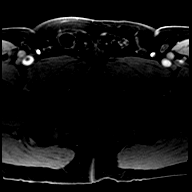

[Series 16: T1 · axial · 3.0mm · 1.15mm/px · 1 of 28 slices shown (5 of 48)]
[im 1/28]
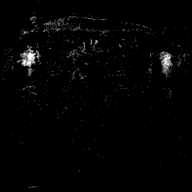

[Series 17: T1 · axial · 3.0mm · 1.15mm/px · 1 of 28 slices shown (6 of 48)]
[im 1/28]
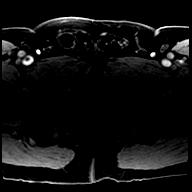

[Series 18: T1 · axial · 3.0mm · 1.15mm/px · 1 of 28 slices shown (7 of 48)]
[im 1/28]
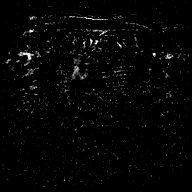

[Series 19: T1 · axial · 3.0mm · 1.15mm/px · 1 of 28 slices shown (8 of 48)]
[im 1/28]
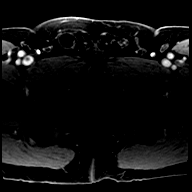

[Series 20: T1 · axial · 3.0mm · 1.15mm/px · 1 of 28 slices shown (9 of 48)]
[im 1/28]
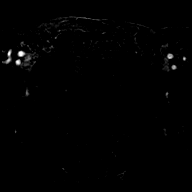

[Series 21: T1 · axial · 3.0mm · 1.15mm/px · 1 of 28 slices shown (10 of 48)]
[im 1/28]
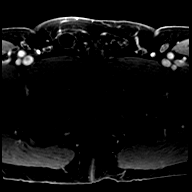

[Series 22: T1 · axial · 3.0mm · 1.15mm/px · 1 of 28 slices shown (11 of 48)]
[im 1/28]
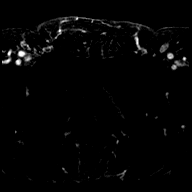

[Series 23: T1 · axial · 3.0mm · 1.15mm/px · 1 of 28 slices shown (12 of 48)]
[im 1/28]
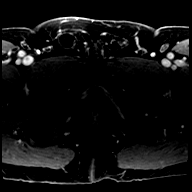

[Series 24: T1 · axial · 3.0mm · 1.15mm/px · 1 of 28 slices shown (13 of 48)]
[im 1/28]
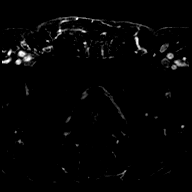

[Series 25: T1 · axial · 3.0mm · 1.15mm/px · 1 of 28 slices shown (14 of 48)]
[im 1/28]
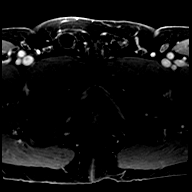

[Series 26: T1 · axial · 3.0mm · 1.15mm/px · 1 of 28 slices shown (15 of 48)]
[im 1/28]
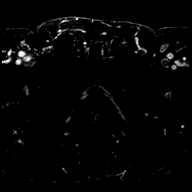

[Series 27: T1 · axial · 3.0mm · 1.15mm/px · 1 of 28 slices shown (16 of 48)]
[im 1/28]
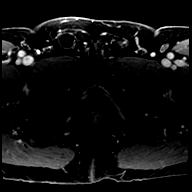

[Series 28: T1 · axial · 3.0mm · 1.15mm/px · 1 of 28 slices shown (17 of 48)]
[im 1/28]
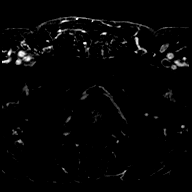

[Series 29: T1 · axial · 3.0mm · 1.15mm/px · 1 of 28 slices shown (18 of 48)]
[im 1/28]
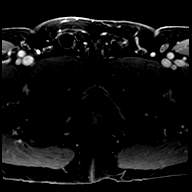

[Series 30: T1 · axial · 3.0mm · 1.15mm/px · 1 of 28 slices shown (19 of 48)]
[im 1/28]
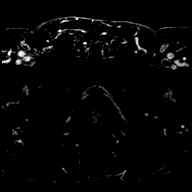

[Series 31: T1 · axial · 3.0mm · 1.15mm/px · 1 of 28 slices shown (20 of 48)]
[im 1/28]
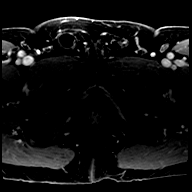

[Series 32: T1 · axial · 3.0mm · 1.15mm/px · 1 of 28 slices shown (21 of 48)]
[im 1/28]
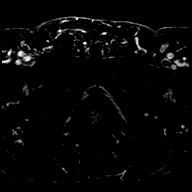

[Series 33: T1 · axial · 3.0mm · 1.15mm/px · 1 of 28 slices shown (22 of 48)]
[im 1/28]
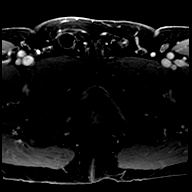

[Series 34: T1 · axial · 3.0mm · 1.15mm/px · 1 of 28 slices shown (23 of 48)]
[im 1/28]
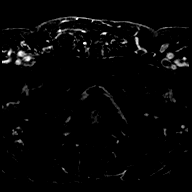

[Series 35: T1 · axial · 3.0mm · 1.15mm/px · 1 of 28 slices shown (24 of 48)]
[im 1/28]
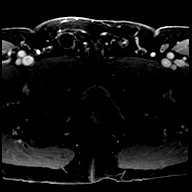

[Series 36: T1 · axial · 3.0mm · 1.15mm/px · 1 of 28 slices shown (25 of 48)]
[im 1/28]
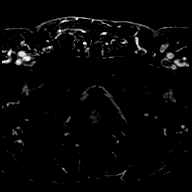

[Series 37: T1 · axial · 3.0mm · 1.15mm/px · 1 of 28 slices shown (26 of 48)]
[im 1/28]
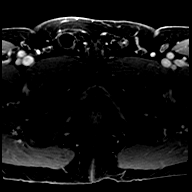

[Series 38: T1 · axial · 3.0mm · 1.15mm/px · 1 of 28 slices shown (27 of 48)]
[im 1/28]
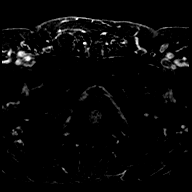

[Series 39: T1 · axial · 3.0mm · 1.15mm/px · 1 of 28 slices shown (28 of 48)]
[im 1/28]
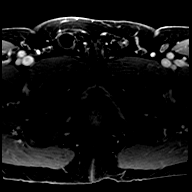

[Series 40: T1 · axial · 3.0mm · 1.15mm/px · 1 of 28 slices shown (29 of 48)]
[im 1/28]
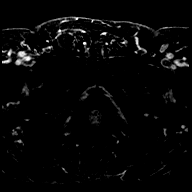

[Series 41: T1 · axial · 3.0mm · 1.15mm/px · 1 of 28 slices shown (30 of 48)]
[im 1/28]
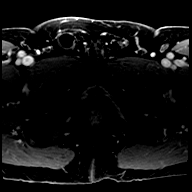

[Series 42: T1 · axial · 3.0mm · 1.15mm/px · 1 of 28 slices shown (31 of 48)]
[im 1/28]
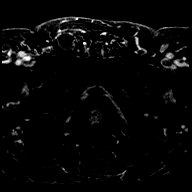

[Series 43: T1 · axial · 3.0mm · 1.15mm/px · 1 of 28 slices shown (32 of 48)]
[im 1/28]
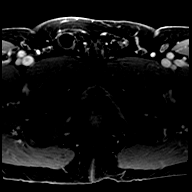

[Series 44: T1 · axial · 3.0mm · 1.15mm/px · 1 of 28 slices shown (33 of 48)]
[im 1/28]
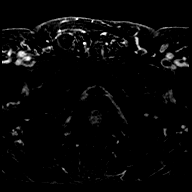

[Series 45: T1 · axial · 3.0mm · 1.15mm/px · 1 of 28 slices shown (34 of 48)]
[im 1/28]
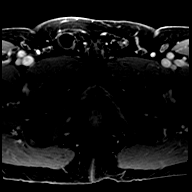

[Series 46: T1 · axial · 3.0mm · 1.15mm/px · 1 of 28 slices shown (35 of 48)]
[im 1/28]
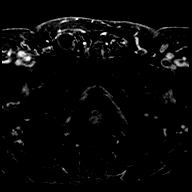

[Series 47: T1 · axial · 3.0mm · 1.15mm/px · 1 of 28 slices shown (36 of 48)]
[im 1/28]
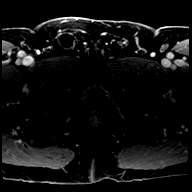

[Series 48: T1 · axial · 3.0mm · 1.15mm/px · 1 of 28 slices shown (37 of 48)]
[im 1/28]
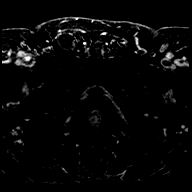

[Series 49: T1 · axial · 3.0mm · 1.15mm/px · 1 of 28 slices shown (38 of 48)]
[im 1/28]
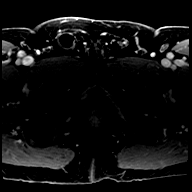

[Series 50: T1 · axial · 3.0mm · 1.15mm/px · 1 of 28 slices shown (39 of 48)]
[im 1/28]
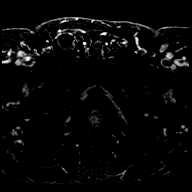

[Series 51: T1 · axial · 3.0mm · 1.15mm/px · 1 of 28 slices shown (40 of 48)]
[im 1/28]
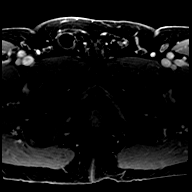

[Series 52: T1 · axial · 3.0mm · 1.15mm/px · 1 of 28 slices shown (41 of 48)]
[im 1/28]
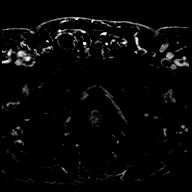

[Series 53: T1 · axial · 3.0mm · 1.15mm/px · 1 of 28 slices shown (42 of 48)]
[im 1/28]
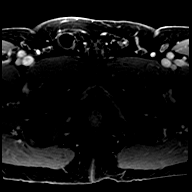

[Series 54: T1 · axial · 3.0mm · 1.15mm/px · 1 of 28 slices shown (43 of 48)]
[im 1/28]
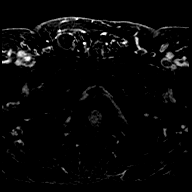

[Series 55: T1 · axial · 3.0mm · 1.15mm/px · 1 of 28 slices shown (44 of 48)]
[im 1/28]
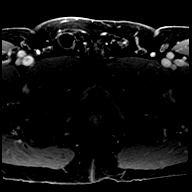

[Series 56: T1 · axial · 3.0mm · 1.15mm/px · 1 of 28 slices shown (45 of 48)]
[im 1/28]
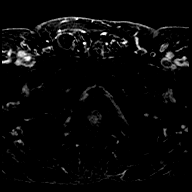

[Series 57: T1 · axial · 3.0mm · 1.15mm/px · 1 of 28 slices shown (46 of 48)]
[im 1/28]
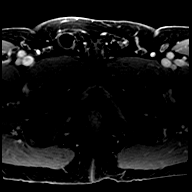

[Series 58: T1 · axial · 3.0mm · 1.15mm/px · 1 of 28 slices shown (47 of 48)]
[im 1/28]
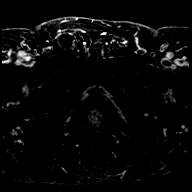

[Series 59: T1 · axial · 3.0mm · 1.15mm/px · 1 of 28 slices shown (48 of 48)]
[im 1/28]
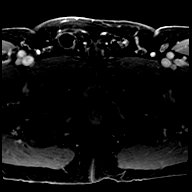

[56 of 56 positions shown; findings below may reference images not displayed]

FINDINGS: Prostate:

Region of interest # 1: PI-RADS category 3 lesion of the left
posterolateral peripheral zone at the apex with reduced T2 signal
but no focal early enhancement or restricted diffusion. This
measures 0.33 cc (1.2 by 0.5 by 0.8 cm) and is shown for example on
image 53 series 11.

Region of interest # 2: PI-RADS category 3 lesion of the right
anterior peripheral zone in the mid gland, with reduced T2 signal
but no substantial focal early enhancement or restricted diffusion.
This measures 0.29 cc (1.0 by 0.6 by 0.8 cm) and is shown for
example on image 45 of series 11. Of note, there is some thinning of
the peripheral zone in this vicinity, making this more likely to be
due to scarring rather than malignancy.

Region of interest # 3: PI-RADS category 3 lesion the left anterior
peripheral zone of the apex, with reduced T2 signal but no focal
early enhancement or restricted diffusion. This measures 0.44 cc
(1.2 by 0.8 by 0.8 cm) and is shown for example on image 62 series
11.

Encapsulated nodularity in the transition zone compatible with
benign prostatic hypertrophy. Low T2 signal stranding diffusely in
the peripheral zone, likely postinflammatory and considered PI-RADS
category 2.

Volume: 3D volumetric analysis: Prostate volume 49.60 cc (5.1 by
by 5.0 cm).

Transcapsular spread:  Absent

Seminal vesicle involvement: Absent

Neurovascular bundle involvement: Absent

Pelvic adenopathy: Absent

Bone metastasis: Absent

Other findings: No supplemental non-categorized findings.
IMPRESSION: 1. Three very small PI-RADS category 3 lesions are present in the
peripheral zone. Targeting data sent to [facility].
2. Mild prostatomegaly.  Benign prostatic hypertrophy.

## 2023-04-29 ENCOUNTER — Other Ambulatory Visit: Payer: Self-pay | Admitting: Urology

## 2023-04-29 DIAGNOSIS — R300 Dysuria: Secondary | ICD-10-CM

## 2023-04-29 NOTE — Progress Notes (Unsigned)
05/01/2023 8:41 PM   Darryl King 1951-04-22 [id number]  Referring provider: Danella Penton, MD [phone] [facility] [address] [facility] [place],  [place] [address]  Urological history: 1. Prostate cancer -PSA (03/2023) 1.70 -favorable intermediate risk prostate cancer -HIFU (11/2021); pretreated w/Orgovyx  2. ED -contributing factors of age, prostate cancer, pelvic surgery, HLD, hypgonadism and alcohol consumption -Trimix  3. Right hydrocele -several in-office aspirations  4. BPH with LU TS -Prostate volume ~50 cc  No chief complaint on file.  HPI: Darryl King is a 72 y.o. male who presents today for burning with urination and decreased flow.    Previous records reviewed.   I PSS *** UA *** PVR ***    Score:  1-7 Mild 8-19 Moderate 20-35 Severe   PMH: Past Medical History:  Diagnosis Date   B12 deficiency    Colon polyp    Diverticulosis    GERD (gastroesophageal reflux disease)    Hyperlipidemia    pt denies, no meds   Laryngopharyngeal reflux    Low testosterone    Prostate cancer (HCC)    prostate   Tubular adenoma of colon 09/2017    Surgical History: Past Surgical History:  Procedure Laterality Date   BACK SURGERY  1985   neck   HIGH INTENSITY FOCUSED ULTRASOUND (HIFU) OF THE PROSTATE N/A 11/25/2021   Procedure: HIGH INTENSITY FOCUSED ULTRASOUND (HIFU) OF THE PROSTATE;  Surgeon: Orson Ape, MD;  Location: [facility] ORS;  Service: Urology;  Laterality: N/A;   PROSTATE BIOPSY N/A 07/22/2021   Procedure: PROSTATE BIOPSY Addison Bailey;  Surgeon: Orson Ape, MD;  Location: [facility] OR[facility];  Service: Urology;  Laterality: N/A;    Home Medications:  Allergies as of 05/01/2023   No Known Allergies      Medication List        Accurate as of April 29, 2023  8:41 PM. If you have any questions, ask your nurse or doctor.          ALPRAZolam 1 MG tablet Commonly known as: XANAX Take 0.5 mg by mouth daily as needed for  anxiety.   cholecalciferol 25 MCG (1000 UNIT) tablet Commonly known as: VITAMIN D3 Take 1,000 Units by mouth daily.   cyanocobalamin 1000 MCG tablet Commonly known as: VITAMIN B12 Take 1 tablet by mouth daily.   famotidine 20 MG tablet Commonly known as: PEPCID Take 20 mg by mouth 2 (two) times daily as needed for heartburn or indigestion. Can be used as needed for breakthrough heartburn or reflux related  chest pain   famotidine 40 MG tablet Commonly known as: PEPCID Take 40 mg by mouth at bedtime.   fluticasone 50 MCG/ACT nasal spray Commonly known as: FLONASE Place 1 spray into both nostrils daily as needed for allergies or rhinitis.   lansoprazole 30 MG capsule Commonly known as: PREVACID Take 1 capsule (30 mg total) by mouth 2 (two) times daily before a meal.   levofloxacin 500 MG tablet Commonly known as: LEVAQUIN Take 500 mg by mouth daily.   Magnesium 250 MG Tabs Take 500 mg by mouth daily.   meloxicam 15 MG tablet Commonly known as: MOBIC Take 15 mg by mouth daily as needed for pain.   Oyster Shell Calcium w/D 500-5 MG-MCG Tabs Take 1 tablet by mouth daily.   SAW PALMETTO PO Take 2 tablets by mouth daily.   SUPER B COMPLEX/C PO Take 1 tablet by mouth daily.   temazepam 30 MG capsule Commonly known  as: RESTORIL Take 10-30 mg by mouth at bedtime as needed for sleep.        Allergies: No Known Allergies  Family History: Family History  Problem Relation Age of Onset   Ulcers Mother    Colon cancer Father    Colon polyps Father    Esophageal cancer Neg Hx    Rectal cancer Neg Hx    Stomach cancer Neg Hx     Social History:  reports that he has never smoked. He has never been exposed to tobacco smoke. He has never used smokeless tobacco. He reports current alcohol use of about 1.0 - 2.0 standard drink of alcohol per week. He reports that he does not use drugs.  ROS: Pertinent ROS in HPI  Physical Exam: There were no vitals taken for this  visit.  Constitutional:  Well nourished. Alert and oriented, No acute distress. HEENT: [place] AT, moist mucus membranes.  Trachea midline, no masses. Cardiovascular: No clubbing, cyanosis, or edema. Respiratory: Normal respiratory effort, no increased work of breathing. GI: Abdomen is soft, non tender, non distended, no abdominal masses. Liver and spleen not palpable.  No hernias appreciated.  Stool sample for occult testing is not indicated.   GU: No CVA tenderness.  No bladder fullness or masses.  Patient with circumcised/uncircumcised phallus. ***Foreskin easily retracted***  Urethral meatus is patent.  No penile discharge. No penile lesions or rashes. Scrotum without lesions, cysts, rashes and/or edema.  Testicles are located scrotally bilaterally. No masses are appreciated in the testicles. Left and right epididymis are normal. Rectal: Patient with  normal sphincter tone. Anus and perineum without scarring or rashes. No rectal masses are appreciated. Prostate is approximately *** grams, *** nodules are appreciated. Seminal vesicles are normal. Skin: No rashes, bruises or suspicious lesions. Lymph: No cervical or inguinal adenopathy. Neurologic: Grossly intact, no focal deficits, moving all 4 extremities. Psychiatric: Normal mood and affect.  Laboratory Data: Component     Latest Ref Rng 03/27/2023  Prostatic Specific Antigen     0.00 - 4.00 ng/mL 1.70     Urinalysis *** I have reviewed the labs.   Pertinent Imaging: ***     Assessment & Plan:  ***  1. BPH with LUTS -PSA stable *** -DRE benign *** -UA benign *** -PVR < 300 cc *** -symptoms - *** -most bothersome symptoms are *** -continue conservative management, avoiding bladder irritants and timed voiding's -Initiate alpha-blocker (***), discussed side effects *** -Initiate 5 alpha reductase inhibitor (***), discussed side effects *** -Continue tamsulosin 0.4 mg daily, alfuzosin 10 mg daily, Rapaflo 8 mg daily, terazosin,  doxazosin, Cialis 5 mg daily and finasteride 5 mg daily, dutasteride 0.5 mg daily***:refills given -Cannot tolerate medication or medication failure, schedule cystoscopy ***  2. Prostate cancer -has appointment in January 2025 w/ Dr. Richardo Hanks   No follow-ups on file.  These notes generated with voice recognition software. I apologize for typographical errors.  Cloretta Ned  [facility] [address]  [address] [place], [place] [address] [phone]

## 2023-05-01 ENCOUNTER — Encounter: Payer: Self-pay | Admitting: Urology

## 2023-05-01 ENCOUNTER — Ambulatory Visit: Payer: Medicare Other | Admitting: Urology

## 2023-05-01 ENCOUNTER — Other Ambulatory Visit
Admission: RE | Admit: 2023-05-01 | Discharge: 2023-05-01 | Disposition: A | Discharge status: Home / Self Care | Payer: Medicare Other | Attending: Urology | Admitting: Urology

## 2023-05-01 VITALS — BP 164/88 | HR 71 | Ht 67.0 in | Wt 161.0 lb

## 2023-05-01 DIAGNOSIS — N433 Hydrocele, unspecified: Secondary | ICD-10-CM | POA: Diagnosis not present

## 2023-05-01 DIAGNOSIS — N138 Other obstructive and reflux uropathy: Secondary | ICD-10-CM

## 2023-05-01 DIAGNOSIS — N411 Chronic prostatitis: Secondary | ICD-10-CM | POA: Diagnosis not present

## 2023-05-01 DIAGNOSIS — R300 Dysuria: Secondary | ICD-10-CM | POA: Diagnosis present

## 2023-05-01 DIAGNOSIS — N401 Enlarged prostate with lower urinary tract symptoms: Secondary | ICD-10-CM | POA: Diagnosis not present

## 2023-05-01 DIAGNOSIS — C61 Malignant neoplasm of prostate: Secondary | ICD-10-CM

## 2023-05-01 LAB — URINALYSIS, COMPLETE (UACMP) WITH MICROSCOPIC
Bacteria, UA: NONE SEEN
Bilirubin Urine: NEGATIVE
Glucose, UA: NEGATIVE mg/dL
Hgb urine dipstick: NEGATIVE
Ketones, ur: NEGATIVE mg/dL
Leukocytes,Ua: NEGATIVE
Nitrite: NEGATIVE
Protein, ur: NEGATIVE mg/dL
RBC / HPF: NONE SEEN RBC/hpf (ref 0–5)
Specific Gravity, Urine: <1.005 — ABNORMAL LOW (ref 1.005–1.030)
Squamous Epithelial / HPF: NONE SEEN /HPF (ref 0–5)
WBC, UA: NONE SEEN WBC/hpf (ref 0–5)
pH: 6 (ref 5.0–8.0)

## 2023-05-01 LAB — BLADDER SCAN AMB NON-IMAGING

## 2023-05-01 MED ORDER — SULFAMETHOXAZOLE-TRIMETHOPRIM 800-160 MG PO TABS
1.0000 | ORAL_TABLET | Freq: Two times a day (BID) | ORAL | 0 refills | Status: AC
Start: 2023-05-01 — End: ?

## 2023-05-02 LAB — URINE CULTURE: Culture: NO GROWTH

## 2023-05-05 ENCOUNTER — Telehealth: Payer: Self-pay | Admitting: *Deleted

## 2023-05-05 NOTE — Telephone Encounter (Signed)
Talked with patient about self cathing and we have not documentation that he was learned how to do this. Offered a appt for self teaching . Patient states he has already got a appt with a  Doctor on Thursday .

## 2023-06-05 ENCOUNTER — Ambulatory Visit: Payer: Medicare Other | Admitting: Urology

## 2023-09-27 ENCOUNTER — Ambulatory Visit: Payer: Medicare Other | Admitting: Urology

## 2023-11-29 ENCOUNTER — Ambulatory Visit: Admitting: Urology
